# Patient Record
Sex: Female | Born: 1935
Health system: Southern US, Community
[De-identification: ages and names within clinical notes are randomized; demographics above are authoritative.]

## PROBLEM LIST (undated history)

## (undated) DIAGNOSIS — I10 Essential (primary) hypertension: Secondary | ICD-10-CM

## (undated) DIAGNOSIS — I2510 Atherosclerotic heart disease of native coronary artery without angina pectoris: Secondary | ICD-10-CM

## (undated) DIAGNOSIS — C189 Malignant neoplasm of colon, unspecified: Secondary | ICD-10-CM

## (undated) DIAGNOSIS — E039 Hypothyroidism, unspecified: Secondary | ICD-10-CM

## (undated) DIAGNOSIS — E7800 Pure hypercholesterolemia, unspecified: Secondary | ICD-10-CM

## (undated) DIAGNOSIS — E119 Type 2 diabetes mellitus without complications: Secondary | ICD-10-CM

## (undated) HISTORY — PX: CATARACT EXTRACTION, BILATERAL: SHX1313

## (undated) HISTORY — PX: LAPAROSCOPIC CHOLECYSTECTOMY: SUR755

## (undated) HISTORY — DX: Essential (primary) hypertension: I10

## (undated) HISTORY — DX: Pure hypercholesterolemia, unspecified: E78.00

## (undated) HISTORY — DX: Malignant neoplasm of colon, unspecified: C18.9

## (undated) HISTORY — DX: Atherosclerotic heart disease of native coronary artery without angina pectoris: I25.10

## (undated) HISTORY — PX: OTHER SURGICAL HISTORY: SHX169

## (undated) HISTORY — PX: APPENDECTOMY: SHX54

## (undated) HISTORY — DX: Hypothyroidism, unspecified: E03.9

## (undated) HISTORY — DX: Type 2 diabetes mellitus without complications: E11.9

---

## 2001-12-30 HISTORY — PX: COLECTOMY: SHX59

## 2005-09-27 ENCOUNTER — Encounter: Payer: Self-pay | Admitting: Cardiology

## 2006-12-30 HISTORY — PX: CORONARY STENT PLACEMENT: SHX1402

## 2011-02-06 ENCOUNTER — Encounter: Payer: Self-pay | Admitting: Family Medicine

## 2011-03-01 ENCOUNTER — Encounter: Payer: Self-pay | Admitting: Family Medicine

## 2011-03-01 ENCOUNTER — Ambulatory Visit (INDEPENDENT_AMBULATORY_CARE_PROVIDER_SITE_OTHER): Payer: Medicare Other | Admitting: Family Medicine

## 2011-03-01 DIAGNOSIS — I2510 Atherosclerotic heart disease of native coronary artery without angina pectoris: Secondary | ICD-10-CM

## 2011-03-01 DIAGNOSIS — I10 Essential (primary) hypertension: Secondary | ICD-10-CM | POA: Insufficient documentation

## 2011-03-01 DIAGNOSIS — E119 Type 2 diabetes mellitus without complications: Secondary | ICD-10-CM | POA: Insufficient documentation

## 2011-03-01 DIAGNOSIS — E785 Hyperlipidemia, unspecified: Secondary | ICD-10-CM | POA: Insufficient documentation

## 2011-03-01 DIAGNOSIS — C182 Malignant neoplasm of ascending colon: Secondary | ICD-10-CM | POA: Insufficient documentation

## 2011-03-07 NOTE — Assessment & Plan Note (Signed)
Summary: NOV DM   Vital Signs:  Patient profile:   77 year old female Height:      64 inches Weight:      168 pounds BMI:     28.94 O2 Sat:      96 % on Room air Pulse rate:   71 / minute BP sitting:   130 / 75  (left arm) Cuff size:   large  Vitals Entered By: Payton Spark CMA (March 01, 2011 1:54 PM)  O2 Flow:  Room air CC: Mew to est. Discuss meds   Primary Care Provider:  Seymour Bars DO  CC:  Mew to est. Discuss meds.  History of Present Illness: 77 yo WF presents for NOV.  She moved here from [place], [place].  She has a hx of colon cancer,  CAD, HTN, DM, high chol, HTN.  She is due for med RFS and is interested to change her AZor to a generic version due to cost.  Reports no problems on ACEi in the past, her BP was just running too high.  She will need to est care locally with cards and GI.  Currently, she feels great.  Her sugars are well controlled but she needs to get tresting supplises.  She had labs done in Nov 2011.     Current Medications (verified): 1)  Atorvastatin Calcium 40 Mg Tabs (Atorvastatin Calcium) .... Take 1 Tab By Mouth At Bedtime 2)  Aspirin 325 Mg Tabs (Aspirin) .... Take 1 Tab By Mouth Once Daily 3)  Ocuvite  Tabs (Multiple Vitamins-Minerals) .... Take 1 Tab By Mouth Once Daily 4)  Glimepiride 4 Mg Tabs (Glimepiride) .... Take 1 Tab By Mouth Two Times A Day 5)  Azor 10-40 Mg Tabs (Amlodipine-Olmesartan) .... Take 1/2 Tab By Mouth Once Daily 6)  Synthroid 100 Mcg Tabs (Levothyroxine Sodium) .... Take 1 Tab By Mouth Once Daily 7)  Metformin Hcl 500 Mg Tabs (Metformin Hcl) .... Take 1 Tab By Mouth Two Times A Day 8)  Metoprolol Tartrate 25 Mg Tabs (Metoprolol Tartrate) .... Take 1 Tab By Mouth Once Daily 9)  Nitrofurantoin Macrocrystal 50 Mg Caps (Nitrofurantoin Macrocrystal) .... Take 1 Tab By Mouth Once Daily  Allergies (verified): 1)  ! Sulfa  Past History:  Past Medical History: CABG, 1998, 2 stents HTN T2DM high  chol Hypothyroidism G4P4  Past Surgical History: [id number] 2 stents 09 Lap Chole  partial colectomy for cancer 2003  Family History: mother DM daughter Hodgkins Lymphoma  Social History: HS grad.  Married with 4 grown kids. Moved from [place]. 5 cigs/ day. No regular exerice.  Review of Systems       no fevers/sweats/weakness, unexplained wt loss/gain, no change in vision, no difficulty hearing, ringing in ears, no hay fever/allergies, no CP/discomfort, no palpitations, no breast lump/nipple discharge, no cough/wheeze, no blood in stool, no N/V/D, no nocturia, no leaking urine, no unusual vag bleeding, no vaginal/penile discharge, no muscle/joint pain, no rash, no new/changing mole, no HA, no memory loss, no anxiety, no sleep problem, no depression, no unexplained lumps, no easy bruising/bleeding, no concern with sexual function   Physical Exam  General:  alert, well-developed, well-nourished, well-hydrated, and overweight-appearing.   Head:  normocephalic and atraumatic.   Mouth:  pharynx pink and moist and no erythema.   Neck:  no masses.   Lungs:  normal respiratory effort, no intercostal retractions, no accessory muscle use, and normal breath sounds.   Heart:  normal rate, regular rhythm, and no murmur.  Extremities:  no LE edema Skin:  color normal.   Cervical Nodes:  No lymphadenopathy noted Psych:  good eye contact, not anxious appearing, and not depressed appearing.     Impression & Recommendations:  Problem # 1:  ESSENTIAL HYPERTENSION, BENIGN (ICD-401.1) Changed Azor to Lotrel due to cost.  Will get old records, update labs next visit and f/u in 2 mos. Her updated medication list for this problem includes:    Amlodipine Besy-benazepril Hcl 5-20 Mg Caps (Amlodipine besy-benazepril hcl) .Marland Kitchen... 1 tab by mouth daily    Metoprolol Succinate 25 Mg Xr24h-tab (Metoprolol succinate) .Marland Kitchen... 1 tab by mouth by mouth once daily  BP today: 130/75  Problem # 2:  DIABETES  MELLITUS, TYPE II (ICD-250.00)  Her updated medication list for this problem includes:    Aspirin 325 Mg Tabs (Aspirin) .Marland Kitchen... Take 1 tab by mouth once daily    Glimepiride 4 Mg Tabs (Glimepiride) .Marland Kitchen... Take 1 tab by mouth two times a day    Amlodipine Besy-benazepril Hcl 5-20 Mg Caps (Amlodipine besy-benazepril hcl) .Marland Kitchen... 1 tab by mouth daily    Metformin Hcl 500 Mg Tabs (Metformin hcl) .Marland Kitchen... Take 1 tab by mouth two times a day  Problem # 3:  CAD (ICD-414.00)  Her updated medication list for this problem includes:    Aspirin 325 Mg Tabs (Aspirin) .Marland Kitchen... Take 1 tab by mouth once daily    Amlodipine Besy-benazepril Hcl 5-20 Mg Caps (Amlodipine besy-benazepril hcl) .Marland Kitchen... 1 tab by mouth daily    Metoprolol Succinate 25 Mg Xr24h-tab (Metoprolol succinate) .Marland Kitchen... 1 tab by mouth by mouth once daily  Problem # 4:  HYPERLIPIDEMIA (ICD-272.4)  Her updated medication list for this problem includes:    Atorvastatin Calcium 40 Mg Tabs (Atorvastatin calcium) .Marland Kitchen... Take 1 tab by mouth at bedtime  Complete Medication List: 1)  Atorvastatin Calcium 40 Mg Tabs (Atorvastatin calcium) .... Take 1 tab by mouth at bedtime 2)  Aspirin 325 Mg Tabs (Aspirin) .... Take 1 tab by mouth once daily 3)  Ocuvite Tabs (Multiple vitamins-minerals) .... Take 1 tab by mouth once daily 4)  Glimepiride 4 Mg Tabs (Glimepiride) .... Take 1 tab by mouth two times a day 5)  Amlodipine Besy-benazepril Hcl 5-20 Mg Caps (Amlodipine besy-benazepril hcl) .Marland Kitchen.. 1 tab by mouth daily 6)  Levothyroxine Sodium 100 Mcg Tabs (Levothyroxine sodium) .Marland Kitchen.. 1 tab by mouth once daily 7)  Metformin Hcl 500 Mg Tabs (Metformin hcl) .... Take 1 tab by mouth two times a day 8)  Metoprolol Succinate 25 Mg Xr24h-tab (Metoprolol succinate) .Marland Kitchen.. 1 tab by mouth by mouth once daily 9)  Nitrofurantoin Macrocrystal 50 Mg Caps (Nitrofurantoin macrocrystal) .... Take 1 tab by mouth once daily  Patient Instructions: 1)  Meds RFd. 2)  Change AZOR to generic  Lotrel once daily for BP. 3)  Call if any problems. 4)  will have [facility] delivery your diabetic supplies.   5)  Return for f/u with labs in 2 mos Prescriptions: METFORMIN HCL 500 MG TABS (METFORMIN HCL) Take 1 tab by mouth two times a day  #180 x 1   Entered and Authorized by:   Seymour Bars DO   Signed by:   Seymour Bars DO on 03/01/2011   Method used:   Electronically to        [facility]* (retail)             ,          Ph: [phone]  Fax: [phone]   RxID:   [id number] LEVOTHYROXINE SODIUM 100 MCG TABS (LEVOTHYROXINE SODIUM) 1 tab by mouth once daily  #90 x 1   Entered and Authorized by:   Seymour Bars DO   Signed by:   Seymour Bars DO on 03/01/2011   Method used:   Electronically to        [facility]* (retail)             ,          Ph: [phone]       Fax: [phone]   RxID:   [id number] GLIMEPIRIDE 4 MG TABS (GLIMEPIRIDE) Take 1 tab by mouth two times a day  #180 x 1   Entered and Authorized by:   Seymour Bars DO   Signed by:   Seymour Bars DO on 03/01/2011   Method used:   Electronically to        [facility]* (retail)             ,          Ph: [phone]       Fax: [phone]   RxID:   [id number] METOPROLOL SUCCINATE 25 MG XR24H-TAB (METOPROLOL SUCCINATE) 1 tab by mouth by mouth once daily  #90 x 1   Entered and Authorized by:   Seymour Bars DO   Signed by:   Seymour Bars DO on 03/01/2011   Method used:   Electronically to        [facility]* (retail)             ,          Ph: [phone]       Fax: [phone]   RxID:   [id number] ATORVASTATIN CALCIUM 40 MG TABS (ATORVASTATIN CALCIUM) Take 1 tab by mouth at bedtime  #90 x 1   Entered and Authorized by:   Seymour Bars DO   Signed by:   Seymour Bars DO on 03/01/2011   Method used:   Electronically to        [facility]* (retail)             ,          Ph: [phone]       Fax: [phone]   RxID:   [id number] AMLODIPINE BESY-BENAZEPRIL  HCL 5-20 MG CAPS (AMLODIPINE BESY-BENAZEPRIL HCL) 1 tab by mouth daily  #90 x 1   Entered and Authorized by:   Seymour Bars DO   Signed by:   Seymour Bars DO on 03/01/2011   Method used:   Electronically to        [facility]* (retail)             ,          Ph: [phone]       Fax: [phone]   RxID:   [id number]    Orders Added: 1)  New Patient Level III [95093]

## 2011-03-12 NOTE — Letter (Signed)
Summary: Intake Forms  Intake Forms   Imported By: Lanelle Bal 03/05/2011 09:05:56  _____________________________________________________________________  External Attachment:    Type:   Image     Comment:   External Document

## 2011-04-01 ENCOUNTER — Ambulatory Visit: Payer: Self-pay | Admitting: Family Medicine

## 2011-05-02 ENCOUNTER — Encounter: Payer: Self-pay | Admitting: Family Medicine

## 2011-05-02 ENCOUNTER — Ambulatory Visit (INDEPENDENT_AMBULATORY_CARE_PROVIDER_SITE_OTHER): Payer: Medicare Other | Admitting: Family Medicine

## 2011-05-02 VITALS — BP 123/69 | HR 65 | Ht 64.0 in | Wt 168.0 lb

## 2011-05-02 DIAGNOSIS — E119 Type 2 diabetes mellitus without complications: Secondary | ICD-10-CM

## 2011-05-02 DIAGNOSIS — Z85038 Personal history of other malignant neoplasm of large intestine: Secondary | ICD-10-CM

## 2011-05-02 DIAGNOSIS — C182 Malignant neoplasm of ascending colon: Secondary | ICD-10-CM

## 2011-05-02 DIAGNOSIS — I2510 Atherosclerotic heart disease of native coronary artery without angina pectoris: Secondary | ICD-10-CM

## 2011-05-02 DIAGNOSIS — E039 Hypothyroidism, unspecified: Secondary | ICD-10-CM

## 2011-05-02 LAB — POCT UA - MICROALBUMIN: Creatinine, POC: 100 mg/dL

## 2011-05-02 NOTE — Progress Notes (Signed)
  Subjective:    Patient ID: Gina [name], female    DOB: 1934/09/10, 77 y.o.   MRN: [id number]  HPI 69 WF presents for f/u visit.  I changed her Azor to Lotrel due to cost and it's working well but she's getting a little dizzy from it.  She is taking Amaryl and Metformin bid for her diabetes.  Her A1C is 7.1 today.  Her home sugars are usually 120s to 130s AM fasting.  She had one low at 59.She is also on Toprol XL for her BP.  Her urine micro and monofilament were normal other than a callus on her heels.  She denies chest pain or DOE.  She still needs a referral to GI and cardiology.    BP 123/69  Pulse 65  Ht 5\' 4"  (1.626 m)  Wt 168 lb (76.204 kg)  BMI 28.84 kg/m2  SpO2 97%  Patient Active Problem List  Diagnoses  . ADENOCARCINOMA, ASCENDING COLON  . DIABETES MELLITUS, TYPE II  . HYPERLIPIDEMIA  . ESSENTIAL HYPERTENSION, BENIGN  . CAD       Review of Systems  Constitutional: Negative for fever, fatigue and unexpected weight change.  Eyes: Negative for visual disturbance.  Respiratory: Negative for shortness of breath.   Cardiovascular: Negative for chest pain, palpitations and leg swelling.  Gastrointestinal: Negative for nausea, abdominal pain and diarrhea.  Genitourinary: Negative for frequency and difficulty urinating.  Neurological: Positive for light-headedness. Negative for weakness, numbness and headaches.  Psychiatric/Behavioral: Negative for dysphoric mood.       Objective:   Physical Exam  Constitutional: She appears well-developed and well-nourished. No distress.  Eyes: Conjunctivae are normal. Pupils are equal, round, and reactive to light.  Neck: Neck supple. No thyromegaly present.  Cardiovascular: Normal rate, regular rhythm and normal heart sounds.   Pulmonary/Chest: Effort normal and breath sounds normal.  Musculoskeletal: She exhibits no edema.  Skin: Skin is warm and dry.  Psychiatric: She has a normal mood and affect.          Assessment  & Plan:

## 2011-05-02 NOTE — Assessment & Plan Note (Signed)
Asymptomatic.  Stable on meds.  Wants to follow with cards annually since she had stents placed in 08.  Will get her in with Dr Jens Som.

## 2011-05-02 NOTE — Assessment & Plan Note (Signed)
A1C great at 7.1.  Continue current meds.  Update labs today.  Monofilament normal other than callused heels.  Urine microalbumin neg.  Will need eye exam soon.

## 2011-05-02 NOTE — Patient Instructions (Signed)
Will get you in with [facility] in [facility]iology here in [place].  [facility] will deliver sugar testing supplies.  A1C good a 7.1.  Labs today.  F/U in 3-4 mos.

## 2011-05-02 NOTE — Assessment & Plan Note (Signed)
Hx of colon cancer, reviewed treatment out of town.  No longer having problems but needs to follow with GI.  Will get her in with [facility].

## 2011-05-03 LAB — CBC WITH DIFFERENTIAL/PLATELET
Basophils Absolute: 0 10*3/uL (ref 0.0–0.1)
Basophils Relative: 0 % (ref 0–1)
Eosinophils Absolute: 0.1 10*3/uL (ref 0.0–0.7)
Eosinophils Relative: 1 % (ref 0–5)
Lymphs Abs: 3 10*3/uL (ref 0.7–4.0)
MCH: 30.7 pg (ref 26.0–34.0)
MCHC: 31 g/dL (ref 30.0–36.0)
MCV: 99 fL (ref 78.0–100.0)
Neutrophils Relative %: 59 % (ref 43–77)
Platelets: 310 10*3/uL (ref 150–400)
RBC: 3.98 MIL/uL (ref 3.87–5.11)
RDW: 13.9 % (ref 11.5–15.5)

## 2011-05-03 LAB — COMPLETE METABOLIC PANEL WITH GFR
ALT: 18 U/L (ref 0–35)
AST: 15 U/L (ref 0–37)
Alkaline Phosphatase: 52 U/L (ref 39–117)
Creat: 0.71 mg/dL (ref 0.40–1.20)
GFR, Est African American: >60 mL/min (ref >60)
Sodium: 140 mEq/L (ref 135–145)
Total Bilirubin: 0.4 mg/dL (ref 0.3–1.2)
Total Protein: 7.7 g/dL (ref 6.0–8.3)

## 2011-05-03 LAB — LIPID PANEL
HDL: 40 mg/dL (ref >39)
LDL Cholesterol: 53 mg/dL (ref 0–99)
Total CHOL/HDL Ratio: 2.9 Ratio
Triglycerides: 108 mg/dL (ref <150)

## 2011-05-03 LAB — TSH: TSH: 1.163 u[IU]/mL (ref 0.350–4.500)

## 2011-05-04 ENCOUNTER — Telehealth: Payer: Self-pay | Admitting: Family Medicine

## 2011-05-04 NOTE — Telephone Encounter (Signed)
Pls let pt know that her blood counts, liver and kidney function caem back normal.  Cholesterol is at goal and thyroid function looks great.

## 2011-05-06 NOTE — Telephone Encounter (Signed)
Pt aware of the below

## 2011-05-22 ENCOUNTER — Other Ambulatory Visit: Payer: Self-pay | Admitting: Family Medicine

## 2011-05-22 NOTE — Telephone Encounter (Signed)
Pt needs lancets, test strips, bs machine sent to the [facility].  Pt stated getting low on supplies.  Dropped off the information to the front staff 05-21-11.

## 2011-05-30 NOTE — Telephone Encounter (Signed)
Pt was suppose to call back with information so this process could be taken care of since she is previously not set up with [facility].  Passed information along to Payton Spark, CMA to hold on to in case the patient calls back with information. Jarvis Newcomer, LPN [facility]

## 2011-05-31 ENCOUNTER — Encounter: Payer: Self-pay | Admitting: Cardiology

## 2011-06-03 ENCOUNTER — Other Ambulatory Visit: Payer: Self-pay | Admitting: Family Medicine

## 2011-06-03 ENCOUNTER — Telehealth: Payer: Self-pay | Admitting: Family Medicine

## 2011-06-03 DIAGNOSIS — I10 Essential (primary) hypertension: Secondary | ICD-10-CM

## 2011-06-03 MED ORDER — AMLODIPINE BESY-BENAZEPRIL HCL 5-20 MG PO CAPS: 1.0000 | ORAL_CAPSULE | Freq: Every day | ORAL | Status: DC

## 2011-06-03 NOTE — Telephone Encounter (Signed)
Pt called and requested a return call from the nurse but did not leave detailed information.   Plan:  [facility] for patient that her call was returned, and she'll need to call back since nurse does not know how to help her at this point since no detailed information left originally.  Pt did call back and nurse got call and pt said she has 3 pills left and [facility] will not release her medication until 06-08-11, however, they should have gotten her med to her by 06-01-11 at which according to last refill date amlodipine was  filled on 03-01-11.  First 90 day supply was finished on 06-01-11, and pt would have needed med by this time to not run out.   Plan:  Pt instructed to call mail order and ask them why they didn't get it to her on time and to tell them they will need to expedite the delivery before 3 days at which time she will be out of medication.  Pt voiced understanding and will call triage nurse back if any problem getting this accomplished. Jarvis Newcomer, LPN [facility]

## 2011-06-03 NOTE — Telephone Encounter (Signed)
Pt called the [facility] mail order and they should have already sent her refill of amlodipine but because she hadn't received they said for our office to send a 14 day supply of the medication and the medicare insurance will cover this 14 day supply while she is waiting on her mail order to arrive. Plan:  Amlodipine 5-20 mg daily # 14 /0refills sent to [facility].  Pt aware. Jarvis Newcomer, LPN [facility]

## 2011-06-19 ENCOUNTER — Encounter: Payer: Self-pay | Admitting: Cardiology

## 2011-06-19 ENCOUNTER — Ambulatory Visit (INDEPENDENT_AMBULATORY_CARE_PROVIDER_SITE_OTHER): Payer: Medicare Other | Admitting: Cardiology

## 2011-06-19 DIAGNOSIS — I10 Essential (primary) hypertension: Secondary | ICD-10-CM

## 2011-06-19 DIAGNOSIS — F17200 Nicotine dependence, unspecified, uncomplicated: Secondary | ICD-10-CM

## 2011-06-19 DIAGNOSIS — Z720 Tobacco use: Secondary | ICD-10-CM

## 2011-06-19 DIAGNOSIS — I2510 Atherosclerotic heart disease of native coronary artery without angina pectoris: Secondary | ICD-10-CM

## 2011-06-19 DIAGNOSIS — E785 Hyperlipidemia, unspecified: Secondary | ICD-10-CM

## 2011-06-19 NOTE — Progress Notes (Signed)
HPI: 77 year old female with past medical history of coronary artery disease for establishment. Patient had coronary artery bypass and graft in [facility] in 1998. In 2008 she had stents placed. I do not have records available. She apparently has had stress tests since then. She recently moved to this area and now presents to establish. She has dyspnea with more extreme activities but not with routine activities. There is no orthopnea, PND, pedal edema, palpitations, syncope or chest pain.  Current Outpatient Prescriptions  Medication Sig Dispense Refill  . amLODipine-benazepril (LOTREL) 5-20 MG per capsule Take 1 capsule by mouth daily.  14 capsule  0  . aspirin 325 MG tablet Take 325 mg by mouth daily.        Marland Kitchen atorvastatin (LIPITOR) 40 MG tablet Take 40 mg by mouth daily.        Marland Kitchen glimepiride (AMARYL) 4 MG tablet Take 4 mg by mouth 2 (two) times daily.        Marland Kitchen levothyroxine (SYNTHROID, LEVOTHROID) 100 MCG tablet Take 100 mcg by mouth daily.        . metFORMIN (GLUCOPHAGE) 500 MG tablet Take 500 mg by mouth 2 (two) times daily with a meal.        . metoprolol succinate (TOPROL-XL) 25 MG 24 hr tablet Take 25 mg by mouth daily.        . Multiple Vitamins-Minerals (MULTIVITAMIN & MINERAL PO) Take by mouth.        . nitrofurantoin (MACRODANTIN) 50 MG capsule Take 50 mg by mouth daily as needed.        . nitroGLYCERIN (NITROSTAT) 0.4 MG SL tablet Place 0.4 mg under the tongue every 5 (five) minutes as needed.          Allergies  Allergen Reactions  . Sulfonamide Derivatives     Past Medical History  Diagnosis Date  . HTN (hypertension)   . T2DM (type 2 diabetes mellitus)   . High cholesterol   . Hypothyroidism   . CAD (coronary artery disease)   . Colon cancer     Chemotherapy    Past Surgical History  Procedure Date  . Cabg     1998.   . Coronary stent placement 2008  . Laparoscopic cholecystectomy   . Colectomy 2003    for cancer. partial  . Appendectomy      History   Social History  . Marital Status: Married    Spouse Name: N/A    Number of Children: 4  . Years of Education: N/A   Occupational History  .      Retired   Social History Main Topics  . Smoking status: Current Everyday Smoker    Types: Cigarettes  . Smokeless tobacco: Not on file   Comment: 5 cigarettes/day   . Alcohol Use: Yes     Occasional glass of wine  . Drug Use: Not on file  . Sexually Active: Not on file   Other Topics Concern  . Not on file   Social History Narrative   Married, 4 grown kids. Moved from [place], no regular exercise.     Family History  Problem Relation Age of Onset  . Diabetes Mother   . Coronary artery disease Father     Died of MI at age 15    ROS: no fevers or chills, productive cough, hemoptysis, dysphasia, odynophagia, melena, hematochezia, dysuria, hematuria, rash, seizure activity, orthopnea, PND, pedal edema, claudication. Remaining systems are negative.  Physical Exam: General:  Well developed/well  nourished in NAD Skin warm/dry Patient not depressed No peripheral clubbing Back-normal HEENT-normal/normal eyelids Neck supple/normal carotid upstroke bilaterally; no bruits; no JVD; no thyromegaly chest - CTA/ normal expansion CV - RRR/normal S1 and S2; no murmurs, rubs or gallops;  PMI nondisplaced Abdomen -NT/ND, no HSM, no mass, + bowel sounds, no bruit 2+ femoral pulses, no bruits Ext-no edema, chords, 2+ DP Neuro-grossly nonfocal  ECG Normal sinus rhythm with no ST changes.

## 2011-06-19 NOTE — Assessment & Plan Note (Signed)
Continue aspirin, beta blocker and statin. We will obtain records from [place] concerning previous bypass surgery, stent and most recent stress test. Continue risk factor modification.

## 2011-06-19 NOTE — Patient Instructions (Signed)
Your physician wants you to follow-up in: ONE YEAR You will receive a reminder letter in the mail two months in advance. If you don't receive a letter, please call our office to schedule the follow-up appointment.  

## 2011-06-19 NOTE — Assessment & Plan Note (Signed)
Patient counseled on discontinuing. 

## 2011-06-19 NOTE — Assessment & Plan Note (Signed)
Blood pressure control. Continue present medications. Potassium and renal function monitored by primary care. 

## 2011-06-19 NOTE — Assessment & Plan Note (Signed)
Continue statin. Lipids and liver monitored by primary care. 

## 2011-06-24 ENCOUNTER — Telehealth: Payer: Self-pay | Admitting: Cardiology

## 2011-06-24 NOTE — Telephone Encounter (Addendum)
ROI Faxed to [facility]  @ [phone]   06/24/11/km  Records received from [facility] gave to [facility] 79/12/km

## 2011-07-15 ENCOUNTER — Ambulatory Visit: Payer: Medicare Other | Admitting: Internal Medicine

## 2011-08-09 ENCOUNTER — Other Ambulatory Visit: Payer: Self-pay | Admitting: Family Medicine

## 2011-08-12 ENCOUNTER — Encounter: Payer: Self-pay | Admitting: Cardiology

## 2011-08-13 ENCOUNTER — Encounter: Payer: Self-pay | Admitting: Family Medicine

## 2011-08-13 DIAGNOSIS — E039 Hypothyroidism, unspecified: Secondary | ICD-10-CM | POA: Insufficient documentation

## 2011-08-13 DIAGNOSIS — N390 Urinary tract infection, site not specified: Secondary | ICD-10-CM | POA: Insufficient documentation

## 2011-08-16 ENCOUNTER — Encounter: Payer: Self-pay | Admitting: Cardiology

## 2011-08-23 ENCOUNTER — Encounter: Payer: Self-pay | Admitting: Family Medicine

## 2011-08-23 ENCOUNTER — Ambulatory Visit (INDEPENDENT_AMBULATORY_CARE_PROVIDER_SITE_OTHER): Payer: Medicare Other | Admitting: Family Medicine

## 2011-08-23 DIAGNOSIS — E119 Type 2 diabetes mellitus without complications: Secondary | ICD-10-CM

## 2011-08-23 DIAGNOSIS — Z23 Encounter for immunization: Secondary | ICD-10-CM

## 2011-08-23 DIAGNOSIS — I10 Essential (primary) hypertension: Secondary | ICD-10-CM

## 2011-08-23 LAB — POCT GLYCOSYLATED HEMOGLOBIN (HGB A1C): Hemoglobin A1C: 6.7

## 2011-08-23 MED ORDER — AMBULATORY NON FORMULARY MEDICATION: Status: DC

## 2011-08-23 MED ORDER — CHLORTHALIDONE 25 MG PO TABS: 25.0000 mg | ORAL_TABLET | Freq: Every day | ORAL | Status: DC

## 2011-08-23 NOTE — Progress Notes (Signed)
  Subjective:    Patient ID: Gina [name], female    DOB: [birth year], 77 y.o.   MRN: [id number]  Diabetes She presents for her follow-up diabetic visit. She has type 2 diabetes mellitus. Her disease course has been improving. There are no hypoglycemic associated symptoms. Pertinent negatives for diabetes include no polydipsia and no polyuria. Symptoms are stable. Current diabetic treatment includes oral agent (monotherapy). She is compliant with treatment all of the time. She rarely participates in exercise. There is no change in her home blood glucose trend. An ACE inhibitor/angiotensin II receptor blocker is being taken. Eye exam is not current.   HTN - Says the metoprolol makes her dizzy. She would like to take this.  Take lotrel at bedtime. Does well with this one. She has been very compliant with her meds. No SOB.     Review of Systems  Genitourinary: Negative for polyuria.  Hematological: Negative for polydipsia.       Objective:   Physical Exam  Constitutional: She is oriented to person, place, and time. She appears well-developed and well-nourished.  HENT:  Head: Normocephalic and atraumatic.  Cardiovascular: Normal rate, regular rhythm and normal heart sounds.   Pulmonary/Chest: Effort normal and breath sounds normal.  Neurological: She is alert and oriented to person, place, and time.  Skin: Skin is warm and dry.  Psychiatric: She has a normal mood and affect. Her behavior is normal.          Assessment & Plan:

## 2011-08-23 NOTE — Assessment & Plan Note (Signed)
BP well controlled but will stop the metoprolol since making her feel dizzy. She has normal kidney function so will start a diuretic. F/u in 1 month for nurse BP check and

## 2011-08-23 NOTE — Patient Instructions (Addendum)
Remember to get your eye exam.  See me in 3 months for your diabetes You can schedule a nurse check in one month for your blood pressure.

## 2011-08-23 NOTE — Assessment & Plan Note (Addendum)
Lab Results  Component Value Date   HGBA1C 6.7 08/23/2011  Doing well on her current regimen.  Discussed getting her shingles vaccine. She wants to wait until next appt for her flu shot. F.U in 3 months. Given pneumonia vaccine today.

## 2011-08-26 ENCOUNTER — Other Ambulatory Visit: Payer: Self-pay | Admitting: Family Medicine

## 2011-08-27 ENCOUNTER — Encounter: Payer: Self-pay | Admitting: Cardiology

## 2011-08-28 ENCOUNTER — Other Ambulatory Visit: Payer: Self-pay | Admitting: *Deleted

## 2011-08-28 MED ORDER — LEVOTHYROXINE SODIUM 100 MCG PO TABS: 100.0000 ug | ORAL_TABLET | Freq: Every day | ORAL | Status: DC

## 2011-08-29 ENCOUNTER — Telehealth: Payer: Self-pay | Admitting: *Deleted

## 2011-08-29 ENCOUNTER — Telehealth: Payer: Self-pay | Admitting: Family Medicine

## 2011-08-29 NOTE — Telephone Encounter (Signed)
Pt notifeid of MD instructions. KJ LPN

## 2011-08-29 NOTE — Telephone Encounter (Signed)
Stop the BP pill through Monday and see if feel better.  It can cause nausea.  I can cause dizziness too but all BP meds can cause dizziness. Call on Monday to let me know if better or not. Make sure staying hydrated.

## 2011-08-29 NOTE — Telephone Encounter (Signed)
Pt called to clarify the message she had received from Kathlene November, LPN earlier today regarding the BP medication. Plan:  Pt informed to stop the current BP medication til next Monday to see if her symptoms and dizziness, nausea will disepate.  Told to call with update report next Tuesday morning since office closed on Monday for holiday.  Pt voiced understanding. Jarvis Newcomer, LPN [facility]

## 2011-08-29 NOTE — Telephone Encounter (Signed)
Pt husband calls and states got the pneumonia vaccine while in for her appt 5 days ago. Has been sick with nausea, diarrhea, no energy, afebrile since. Also you changed her BP med due to dizziness from the last BP med. Is having dizziness as well. Question if this is from BP med or pneumo vac. Please advise

## 2011-09-03 ENCOUNTER — Telehealth: Payer: Self-pay | Admitting: Family Medicine

## 2011-09-03 MED ORDER — AMLODIPINE BESY-BENAZEPRIL HCL 10-40 MG PO CAPS: 1.0000 | ORAL_CAPSULE | Freq: Every day | ORAL | Status: DC

## 2011-09-03 NOTE — Telephone Encounter (Signed)
If she is ok with it we can increase her lotrel.  Let me know if ok.

## 2011-09-03 NOTE — Telephone Encounter (Signed)
Pt is willing to do increase on the lotrel.  Please advise of new dose. Plan:  Routed to Dr. Marlyne Beards, LPN [facility]

## 2011-09-03 NOTE — Telephone Encounter (Signed)
New rx sent to local pharm.

## 2011-09-03 NOTE — Telephone Encounter (Signed)
Pt called and spoke with the triage nurse today, and said she had been recently placed on a BP med hygroton and she started experiencing symptoms such as:  Extreme fatigue and nausea, and eyes felt swollen.  She was told to stop the medication due to these side effects.  Pt called to report that since she was told to stop the medication last week that her symptoms have disepated, and she feels much better. Plan:  Routed this encounter to the provider for review and or further instructions/orders. Gina Newcomer, LPN [facility]

## 2011-09-03 NOTE — Telephone Encounter (Signed)
Pt informed of the new dose that changed from 5/20 to 10/40mg .  Notified [facility] to make sure they had Rx ready for the pt to pup.  They said they would have ready in 30 minutes.  Pt informed. Jarvis Newcomer, LPN [facility]

## 2011-09-17 ENCOUNTER — Other Ambulatory Visit: Payer: Self-pay | Admitting: Family Medicine

## 2011-09-17 MED ORDER — ATORVASTATIN CALCIUM 40 MG PO TABS: 40.0000 mg | ORAL_TABLET | Freq: Every day | ORAL | Status: DC

## 2011-09-17 NOTE — Telephone Encounter (Signed)
Pt called for refill of her atorvastatin.  Send to [facility] order.  Plan:  Reviewed the chart.  Last lab for chol 04-2011.  Labs looked good.  Refilled 90 day supply without any refills.  Pt will then need labs. Jarvis Newcomer, LPN [facility]

## 2011-09-26 ENCOUNTER — Ambulatory Visit: Payer: Medicare Other

## 2011-09-27 ENCOUNTER — Other Ambulatory Visit: Payer: Self-pay | Admitting: *Deleted

## 2011-09-27 MED ORDER — GLIMEPIRIDE 4 MG PO TABS: 4.0000 mg | ORAL_TABLET | Freq: Two times a day (BID) | ORAL | Status: DC

## 2011-11-03 ENCOUNTER — Other Ambulatory Visit: Payer: Self-pay | Admitting: Family Medicine

## 2011-11-12 ENCOUNTER — Other Ambulatory Visit: Payer: Self-pay | Admitting: *Deleted

## 2011-11-29 ENCOUNTER — Encounter: Payer: Self-pay | Admitting: Family Medicine

## 2011-12-05 ENCOUNTER — Encounter: Payer: Self-pay | Admitting: Family Medicine

## 2011-12-05 ENCOUNTER — Ambulatory Visit (INDEPENDENT_AMBULATORY_CARE_PROVIDER_SITE_OTHER): Payer: Medicare Other | Admitting: Family Medicine

## 2011-12-05 DIAGNOSIS — E119 Type 2 diabetes mellitus without complications: Secondary | ICD-10-CM

## 2011-12-05 DIAGNOSIS — I10 Essential (primary) hypertension: Secondary | ICD-10-CM

## 2011-12-05 DIAGNOSIS — Z23 Encounter for immunization: Secondary | ICD-10-CM

## 2011-12-05 MED ORDER — AMBULATORY NON FORMULARY MEDICATION: Status: DC

## 2011-12-05 NOTE — Patient Instructions (Signed)
We will make a referral for your eye exam.

## 2011-12-05 NOTE — Progress Notes (Signed)
  Subjective:    Patient ID: Gina [name], female    DOB: 1934/09/22, 77 y.o.   MRN: [id number]  Diabetes She presents for her follow-up diabetic visit. She has type 2 diabetes mellitus. Her disease course has been stable. There are no hypoglycemic associated symptoms. Pertinent negatives for diabetes include no blurred vision, no foot paresthesias, no foot ulcerations, no polydipsia, no polyphagia, no polyuria, no visual change and no weight loss. Symptoms are stable. Current diabetic treatment includes oral agent (monotherapy). She is compliant with treatment all of the time. There is no change in her home blood glucose trend. Her breakfast blood glucose range is generally 110-130 mg/dl.     Review of Systems  Constitutional: Negative for weight loss.  Eyes: Negative for blurred vision.  Genitourinary: Negative for polyuria.  Hematological: Negative for polydipsia and polyphagia.       Objective:   Physical Exam  Constitutional: She is oriented to person, place, and time. She appears well-developed.  HENT:  Head: Normocephalic and atraumatic.  Cardiovascular: Normal rate, regular rhythm and normal heart sounds.   Pulmonary/Chest: Effort normal and breath sounds normal.  Musculoskeletal: She exhibits no edema.  Neurological: She is alert and oriented to person, place, and time.  Skin: Skin is warm and dry.  Psychiatric: She has a normal mood and affect.          Assessment & Plan:  DM- A1C looks great. It 6.4. Doing well.  Continue current regimen. F.U in 4 mo. Due for eye exam. Hasn't found an eye doc in the area so will make referral for her.   HTN - Looks great! contiue current regimen. F/U in 4 mo.

## 2011-12-13 ENCOUNTER — Other Ambulatory Visit: Payer: Self-pay | Admitting: Family Medicine

## 2011-12-17 ENCOUNTER — Other Ambulatory Visit: Payer: Self-pay | Admitting: *Deleted

## 2012-01-15 ENCOUNTER — Telehealth: Payer: Self-pay | Admitting: *Deleted

## 2012-01-15 NOTE — Telephone Encounter (Signed)
They wanted to let you know that the pt just called and cancelled this referral that you had scheduled for them.   FYI

## 2012-02-13 ENCOUNTER — Other Ambulatory Visit: Payer: Self-pay | Admitting: *Deleted

## 2012-02-13 MED ORDER — LEVOTHYROXINE SODIUM 100 MCG PO TABS: 100.0000 ug | ORAL_TABLET | Freq: Every day | ORAL | Status: DC

## 2012-02-13 MED ORDER — GLIMEPIRIDE 4 MG PO TABS: 4.0000 mg | ORAL_TABLET | Freq: Two times a day (BID) | ORAL | Status: DC

## 2012-02-13 MED ORDER — AMLODIPINE BESY-BENAZEPRIL HCL 10-40 MG PO CAPS: 1.0000 | ORAL_CAPSULE | Freq: Every day | ORAL | Status: DC

## 2012-03-25 ENCOUNTER — Other Ambulatory Visit: Payer: Self-pay | Admitting: *Deleted

## 2012-03-25 MED ORDER — ATORVASTATIN CALCIUM 40 MG PO TABS: 40.0000 mg | ORAL_TABLET | Freq: Every day | ORAL | Status: DC

## 2012-04-17 ENCOUNTER — Encounter: Payer: Self-pay | Admitting: *Deleted

## 2012-04-24 ENCOUNTER — Ambulatory Visit (INDEPENDENT_AMBULATORY_CARE_PROVIDER_SITE_OTHER): Payer: Medicare Other | Admitting: Family Medicine

## 2012-04-24 ENCOUNTER — Encounter: Payer: Self-pay | Admitting: Family Medicine

## 2012-04-24 VITALS — BP 124/77 | HR 102 | Wt 166.0 lb

## 2012-04-24 DIAGNOSIS — I10 Essential (primary) hypertension: Secondary | ICD-10-CM | POA: Diagnosis not present

## 2012-04-24 DIAGNOSIS — Z9181 History of falling: Secondary | ICD-10-CM

## 2012-04-24 DIAGNOSIS — E119 Type 2 diabetes mellitus without complications: Secondary | ICD-10-CM

## 2012-04-24 DIAGNOSIS — Z1331 Encounter for screening for depression: Secondary | ICD-10-CM

## 2012-04-24 DIAGNOSIS — Z1211 Encounter for screening for malignant neoplasm of colon: Secondary | ICD-10-CM

## 2012-04-24 LAB — POCT GLYCOSYLATED HEMOGLOBIN (HGB A1C): Hemoglobin A1C: 6.7

## 2012-04-24 NOTE — Progress Notes (Addendum)
  Subjective:    Patient ID: Gina [name], female    DOB: [birth year], 78 y.o.   MRN: [id number]  Hypertension This is a chronic problem. The current episode started more than 1 month ago. The problem is controlled. Pertinent negatives include no blurred vision, chest pain or shortness of breath. There are no associated agents to hypertension. Past treatments include ACE inhibitors and calcium channel blockers. The current treatment provides mild improvement. There are no compliance problems.   Diabetes She has type 2 diabetes mellitus. Her disease course has been stable. There are no hypoglycemic associated symptoms. Pertinent negatives for diabetes include no blurred vision, no chest pain, no polydipsia, no polyphagia, no polyuria and no visual change. There are no hypoglycemic complications. Symptoms are stable. There are no diabetic complications. Risk factors for coronary artery disease include obesity and sedentary lifestyle. Current diabetic treatment includes oral agent (dual therapy). She is compliant with treatment all of the time. She rarely participates in exercise. There is no change in her home blood glucose trend. An ACE inhibitor/angiotensin II receptor blocker is being taken. Eye exam is not current.      Review of Systems  Eyes: Negative for blurred vision.  Respiratory: Negative for shortness of breath.   Cardiovascular: Negative for chest pain.  Genitourinary: Negative for polyuria.  Hematological: Negative for polydipsia and polyphagia.       Objective:   Physical Exam  Constitutional: She is oriented to person, place, and time. She appears well-developed and well-nourished.  HENT:  Head: Normocephalic and atraumatic.  Cardiovascular: Normal rate, regular rhythm and normal heart sounds.        No carotid bruits.   Pulmonary/Chest: Effort normal and breath sounds normal.  Neurological: She is alert and oriented to person, place, and time.  Skin: Skin is warm and  dry.  Psychiatric: She has a normal mood and affect. Her behavior is normal.          Assessment & Plan:  HTN - blood pressure is well controlled. Continue current regimen. She is due for CMP and fasting lipid panel. Given lab slip today.  DM- doing well. Her A1c today is at goal but is up a little bit from December. Encouraged her to try getting some type of regular exercise routine. I gave her handout about diabetes and exercise. She plans on getting her eye exams and is a check with her insurance.  She wants to postpone the shingles vaccine in the next year.  Screening colonoscopy. She is due. Her last one was in [place], so we  will make a referral here locally.  Fall risk-score of 4 which is low risk.  Depression screening-PHQ of 1. Negative for depression.

## 2012-04-24 NOTE — Patient Instructions (Signed)

## 2012-05-04 DIAGNOSIS — E119 Type 2 diabetes mellitus without complications: Secondary | ICD-10-CM | POA: Diagnosis not present

## 2012-05-05 LAB — COMPLETE METABOLIC PANEL WITH GFR
CO2: 23 mEq/L (ref 19–32)
Creat: 0.67 mg/dL (ref 0.50–1.10)
GFR, Est African American: >89 mL/min
GFR, Est Non African American: 86 mL/min
Glucose, Bld: 159 mg/dL — ABNORMAL HIGH (ref 70–99)
Total Bilirubin: 0.3 mg/dL (ref 0.3–1.2)
Total Protein: 7.2 g/dL (ref 6.0–8.3)

## 2012-05-05 LAB — LIPID PANEL
HDL: 41 mg/dL (ref >39)
Triglycerides: 117 mg/dL (ref <150)

## 2012-06-23 ENCOUNTER — Other Ambulatory Visit: Payer: Self-pay | Admitting: Family Medicine

## 2012-07-24 ENCOUNTER — Ambulatory Visit: Payer: Medicare Other | Admitting: Family Medicine

## 2012-07-29 DIAGNOSIS — H5210 Myopia, unspecified eye: Secondary | ICD-10-CM | POA: Diagnosis not present

## 2012-07-29 DIAGNOSIS — H52229 Regular astigmatism, unspecified eye: Secondary | ICD-10-CM | POA: Diagnosis not present

## 2012-07-29 DIAGNOSIS — H2589 Other age-related cataract: Secondary | ICD-10-CM | POA: Diagnosis not present

## 2012-07-29 DIAGNOSIS — H524 Presbyopia: Secondary | ICD-10-CM | POA: Diagnosis not present

## 2012-07-29 DIAGNOSIS — H353190 Nonexudative age-related macular degeneration, unspecified eye, stage unspecified: Secondary | ICD-10-CM | POA: Diagnosis not present

## 2012-07-29 DIAGNOSIS — H01009 Unspecified blepharitis unspecified eye, unspecified eyelid: Secondary | ICD-10-CM | POA: Diagnosis not present

## 2012-08-26 ENCOUNTER — Encounter: Payer: Self-pay | Admitting: Family Medicine

## 2012-08-26 ENCOUNTER — Ambulatory Visit (INDEPENDENT_AMBULATORY_CARE_PROVIDER_SITE_OTHER): Payer: Medicare Other | Admitting: Family Medicine

## 2012-08-26 VITALS — BP 139/73 | HR 68 | Wt 160.0 lb

## 2012-08-26 DIAGNOSIS — Z85038 Personal history of other malignant neoplasm of large intestine: Secondary | ICD-10-CM | POA: Diagnosis not present

## 2012-08-26 DIAGNOSIS — E119 Type 2 diabetes mellitus without complications: Secondary | ICD-10-CM

## 2012-08-26 DIAGNOSIS — F411 Generalized anxiety disorder: Secondary | ICD-10-CM

## 2012-08-26 DIAGNOSIS — F419 Anxiety disorder, unspecified: Secondary | ICD-10-CM

## 2012-08-26 DIAGNOSIS — G609 Hereditary and idiopathic neuropathy, unspecified: Secondary | ICD-10-CM | POA: Diagnosis not present

## 2012-08-26 DIAGNOSIS — G629 Polyneuropathy, unspecified: Secondary | ICD-10-CM

## 2012-08-26 LAB — POCT UA - MICROALBUMIN
Albumin/Creatinine Ratio, Urine, POC: <30
Creatinine, POC: 50 mg/dL
Microalbumin Ur, POC: 10 mg/dL

## 2012-08-26 MED ORDER — ALPRAZOLAM 0.5 MG PO TABS: 0.2500 mg | ORAL_TABLET | Freq: Three times a day (TID) | ORAL | Status: DC | PRN

## 2012-08-26 NOTE — Patient Instructions (Signed)
Remember to get your diabetic eye exam.  

## 2012-08-26 NOTE — Progress Notes (Signed)
  Subjective:    Patient ID: Gina [name], female    DOB: [birth year], 78 y.o.   MRN: [id number]  Diabetes She presents for her follow-up diabetic visit. She has type 2 diabetes mellitus. Her disease course has been stable. There are no hypoglycemic associated symptoms. Pertinent negatives for diabetes include no blurred vision, no foot paresthesias, no foot ulcerations, no polydipsia, no polyphagia, no polyuria and no visual change. Symptoms are stable. Her weight is stable. There is no change in her home blood glucose trend. An ACE inhibitor/angiotensin II receptor blocker is being taken.    Anxiety - Husband has been in the hospital with a large bleeding ulcer.  They plan on sending him home today.  She's been very stressed because of this. She says she used to take alprazolam and use it sparingly. Says she would like a refill on her. In fact she has an old bottle home with lots of tabs left but says it has expired.  She does complain of some tingling at times in her feet. No pain. No numbness. She says it feels more like a tingling. This is started more recently the last few weeks. No worsening or alleviating symptoms.  Review of Systems  Eyes: Negative for blurred vision.  Genitourinary: Negative for polyuria.  Hematological: Negative for polydipsia and polyphagia.       Objective:   Physical Exam  Constitutional: She is oriented to person, place, and time. She appears well-developed and well-nourished.  HENT:  Head: Normocephalic and atraumatic.  Cardiovascular: Normal rate, regular rhythm and normal heart sounds.        DP pulses 2+.    Pulmonary/Chest: Effort normal and breath sounds normal.  Neurological: She is alert and oriented to person, place, and time.  Skin: Skin is warm and dry.  Psychiatric: She has a normal mood and affect. Her behavior is normal.          Assessment & Plan:  DM- well controlled. Due for monofilament her microalbumin. Continue current  regimen. A1C is 6.7. Urine microalbumin was normal. Unfortunately we will do her monofilament at the next office visit we did not perform it today.  Anxiety - Doing ok. She would like a refill on alprazolam which are previous provider prescribed for her.  She has had continuing in her feet out like to check a B12. Normally her A1c looks fantastic so I do not think that diabetic neuropathy.  History of colon cancer-we had a long discussion about the fact that she needs to be reevaluated for repeat colonoscopy. She fell because she was 77 she no longer needed them but I reminded her that she did have colon cancer 10 years ago and is to be followed a little bit more closely. She says her husband sees Dr. Yevonne Pax at GI here in town and she would like to see him. I asked her to call me if she needs a referral but she says she plans to talk to Dr. Yevonne Pax at his next OV.

## 2012-08-27 LAB — TSH: TSH: 1.012 u[IU]/mL (ref 0.350–4.500)

## 2012-09-07 ENCOUNTER — Other Ambulatory Visit: Payer: Self-pay | Admitting: Family Medicine

## 2012-09-09 ENCOUNTER — Other Ambulatory Visit: Payer: Self-pay | Admitting: Family Medicine

## 2012-09-10 ENCOUNTER — Other Ambulatory Visit: Payer: Self-pay | Admitting: *Deleted

## 2012-09-10 ENCOUNTER — Other Ambulatory Visit: Payer: Self-pay | Admitting: Family Medicine

## 2012-09-10 MED ORDER — METFORMIN HCL 500 MG PO TABS: 500.0000 mg | ORAL_TABLET | Freq: Two times a day (BID) | ORAL | Status: DC

## 2012-09-16 ENCOUNTER — Other Ambulatory Visit: Payer: Self-pay | Admitting: Family Medicine

## 2012-10-03 DIAGNOSIS — Z23 Encounter for immunization: Secondary | ICD-10-CM | POA: Diagnosis not present

## 2012-12-02 ENCOUNTER — Encounter: Payer: Self-pay | Admitting: Physician Assistant

## 2012-12-02 ENCOUNTER — Ambulatory Visit (INDEPENDENT_AMBULATORY_CARE_PROVIDER_SITE_OTHER): Payer: Medicare Other | Admitting: Physician Assistant

## 2012-12-02 VITALS — BP 113/60 | HR 68 | Temp 97.9°F | Ht 65.0 in | Wt 160.0 lb

## 2012-12-02 DIAGNOSIS — H9209 Otalgia, unspecified ear: Secondary | ICD-10-CM

## 2012-12-02 DIAGNOSIS — J069 Acute upper respiratory infection, unspecified: Secondary | ICD-10-CM

## 2012-12-02 DIAGNOSIS — J029 Acute pharyngitis, unspecified: Secondary | ICD-10-CM

## 2012-12-02 DIAGNOSIS — H9201 Otalgia, right ear: Secondary | ICD-10-CM

## 2012-12-02 LAB — POCT RAPID STREP A (OFFICE): Rapid Strep A Screen: NEGATIVE

## 2012-12-02 MED ORDER — FLUTICASONE PROPIONATE 50 MCG/ACT NA SUSP: 2.0000 | Freq: Every day | NASAL | Status: DC

## 2012-12-02 NOTE — Progress Notes (Signed)
  Subjective:    Patient ID: Gina [name], female    DOB: 18-Apr-1934, 78 y.o.   MRN: [id number]  HPI Patient is a 78 yo female who presents to the clinic with sore throat that started last night. She has a very sick husband and she can't afford to give him anything. She is also having some right ear pain. No ear discharge. She denies any fever, chills, muscle aches, cough, SOB, HA's, or wheezing. She has not taken anything to make better. She has gargled with salt water which feels like it has helped some.She denies any sick contacts. She does have a history of strep. It has been 2 years since last strep throat but at that time doctors threaten to have to remove tonsils. She denies any sinus pressure.    Review of Systems     Objective:   Physical Exam  Constitutional: She is oriented to person, place, and time. She appears well-developed and well-nourished.  HENT:  Head: Normocephalic and atraumatic.  Right Ear: External ear normal.  Left Ear: External ear normal.  Nose: Nose normal.  Mouth/Throat: Oropharynx is clear and moist. No oropharyngeal exudate.       External canals normal bilaterally. TM of right ear revealed bulging TM without dullness. Light reflex present and ossicles in site.   Negative maxillary and frontal sinus tenderness to palpation.   Bilateral turbinates red and swollen.   Eyes: Conjunctivae normal are normal.  Neck: Normal range of motion. Neck supple.  Cardiovascular: Normal rate, regular rhythm and normal heart sounds.   Pulmonary/Chest: Effort normal and breath sounds normal. She has no wheezes.  Lymphadenopathy:    She has no cervical adenopathy.  Neurological: She is alert and oriented to person, place, and time.  Skin: Skin is warm and dry.  Psychiatric: She has a normal mood and affect. Her behavior is normal.          Assessment & Plan:  Sore throat/URI- Rapid Strep negative. Will send for culture due to history. Reassured patient that I  suspect infection is viral due to time frame and symptoms. Discussed loading up on vitamin c and cold eeze. Gave rx for flonase to help drain fluid behind ears. Encouraged sudafed for next 3 days to also help dry up excess fluid. Encouraged ibuprofen or Tylenol for pain control. Gave handout for other symptomatic treatment for sore throat. Call if not improving.

## 2012-12-02 NOTE — Patient Instructions (Addendum)
cold-eeze tablets and Vitamin C.   Sudafed for the next couple of days to help dry fluid behind ear up. Also start using Flonase 2 sprays once a day.   Sore Throat Sore throats may be caused by bacteria and viruses. They may also be caused by:  Smoking.  Pollution.  Allergies. If a sore throat is due to strep infection (a bacterial infection), you may need:  A throat swab.  A culture test to verify the strep infection. You will need one of these:  An antibiotic shot.  Oral medicine for a full 10 days. Strep infection is very contagious. A doctor should check any close contacts who have a sore throat or fever. A sore throat caused by a virus infection will usually last only 3-4 days. Antibiotics will not treat a viral sore throat.  Infectious mononucleosis (a viral disease), however, can cause a sore throat that lasts for up to 3 weeks. Mononucleosis can be diagnosed with blood tests. You must have been sick for at least 1 week in order for the test to give accurate results. HOME CARE INSTRUCTIONS   To treat a sore throat, take mild pain medicine.  Increase your fluids.  Eat a soft diet.  Do not smoke.  Gargling with warm water or salt water (1 tsp. salt in 8 oz. water) can be helpful.  Try throat sprays or lozenges or sucking on hard candy to ease the symptoms. Call your doctor if your sore throat lasts longer than 1 week.  SEEK IMMEDIATE MEDICAL CARE IF:  You have difficulty breathing.  You have increased swelling in the throat.  You have pain so severe that you are unable to swallow fluids or your saliva.  You have a severe headache, a high fever, vomiting, or a red rash. Document Released: 01/23/2005 Document Revised: 03/09/2012 Document Reviewed: 12/03/2007 [facility] Patient Information 2013 [place], [place].

## 2012-12-05 LAB — CULTURE, GROUP A STREP: Organism ID, Bacteria: NORMAL

## 2012-12-06 ENCOUNTER — Other Ambulatory Visit: Payer: Self-pay | Admitting: Family Medicine

## 2012-12-31 ENCOUNTER — Other Ambulatory Visit: Payer: Self-pay | Admitting: Family Medicine

## 2013-01-27 ENCOUNTER — Other Ambulatory Visit: Payer: Self-pay | Admitting: Family Medicine

## 2013-01-28 ENCOUNTER — Other Ambulatory Visit: Payer: Self-pay | Admitting: Family Medicine

## 2013-02-04 ENCOUNTER — Other Ambulatory Visit: Payer: Self-pay | Admitting: Family Medicine

## 2013-03-05 ENCOUNTER — Ambulatory Visit: Payer: Medicare Other | Admitting: Family Medicine

## 2013-03-08 ENCOUNTER — Ambulatory Visit (INDEPENDENT_AMBULATORY_CARE_PROVIDER_SITE_OTHER): Payer: Medicare Other | Admitting: Family Medicine

## 2013-03-08 ENCOUNTER — Encounter: Payer: Self-pay | Admitting: Family Medicine

## 2013-03-08 VITALS — BP 114/58 | HR 65 | Ht 65.0 in | Wt 160.0 lb

## 2013-03-08 DIAGNOSIS — F430 Acute stress reaction: Secondary | ICD-10-CM

## 2013-03-08 DIAGNOSIS — I10 Essential (primary) hypertension: Secondary | ICD-10-CM

## 2013-03-08 DIAGNOSIS — E119 Type 2 diabetes mellitus without complications: Secondary | ICD-10-CM | POA: Diagnosis not present

## 2013-03-08 DIAGNOSIS — E039 Hypothyroidism, unspecified: Secondary | ICD-10-CM

## 2013-03-08 DIAGNOSIS — E785 Hyperlipidemia, unspecified: Secondary | ICD-10-CM

## 2013-03-08 LAB — POCT GLYCOSYLATED HEMOGLOBIN (HGB A1C): Hemoglobin A1C: 6.3

## 2013-03-08 MED ORDER — GLIMEPIRIDE 4 MG PO TABS: 4.0000 mg | ORAL_TABLET | Freq: Two times a day (BID) | ORAL | Status: DC

## 2013-03-08 MED ORDER — METFORMIN HCL 500 MG PO TABS: ORAL_TABLET | ORAL | Status: DC

## 2013-03-08 MED ORDER — ATORVASTATIN CALCIUM 40 MG PO TABS: 40.0000 mg | ORAL_TABLET | Freq: Every day | ORAL | Status: DC

## 2013-03-08 MED ORDER — ESCITALOPRAM OXALATE 10 MG PO TABS: 10.0000 mg | ORAL_TABLET | Freq: Every day | ORAL | Status: DC

## 2013-03-08 MED ORDER — LEVOTHYROXINE SODIUM 100 MCG PO TABS: ORAL_TABLET | ORAL | Status: DC

## 2013-03-08 MED ORDER — AMLODIPINE BESY-BENAZEPRIL HCL 10-40 MG PO CAPS: ORAL_CAPSULE | ORAL | Status: DC

## 2013-03-08 NOTE — Patient Instructions (Signed)
Please consider shingles vaccine. She may want to check with her Medicare part D. to see about coverage. Shingles Vaccine What You Need to Know WHAT IS SHINGLES?  Shingles is a painful skin rash, often with blisters. It is also called Herpes Zoster or just Zoster.  A shingles rash usually appears on one side of the face or body and lasts from 2 to 4 weeks. Its main symptom is pain, which can be quite severe. Other symptoms of shingles can include fever, headache, chills, and upset stomach. Very rarely, a shingles infection can lead to pneumonia, hearing problems, blindness, brain inflammation (encephalitis), or death.  For about 1 person in 5, severe pain can continue even after the rash clears up. This is called post-herpetic neuralgia.  Shingles is caused by the Varicella Zoster virus. This is the same virus that causes chickenpox. Only someone who has had a case of chickenpox or rarely, has gotten chickenpox vaccine, can get shingles. The virus stays in your body. It can reappear many years later to cause a case of shingles.  You cannot catch shingles from another person with shingles. However, a person who has never had chickenpox (or chickenpox vaccine) could get chickenpox from someone with shingles. This is not very common.  Shingles is far more common in people 57 and older than in younger people. It is also more common in people whose immune systems are weakened because of a disease such as cancer or drugs such as steroids or chemotherapy.  At least 1 million people get shingles per year in the [place]. SHINGLES VACCINE  A vaccine for shingles was licensed in 2006. In clinical trials, the vaccine reduced the risk of shingles by 50%. It can also reduce the pain in people who still get shingles after being vaccinated.  A single dose of shingles vaccine is recommended for adults 70 years of age and older. SOME PEOPLE SHOULD NOT GET SHINGLES VACCINE OR SHOULD WAIT A person should  not get shingles vaccine if he or she:  Has ever had a life-threatening allergic reaction to gelatin, the antibiotic neomycin, or any other component of shingles vaccine. Tell your caregiver if you have any severe allergies.  Has a weakened immune system because of current:  AIDS or another disease that affects the immune system.  Treatment with drugs that affect the immune system, such as prolonged use of high-dose steroids.  Cancer treatment, such as radiation or chemotherapy.  Cancer affecting the bone marrow or lymphatic system, such as leukemia or lymphoma.  Is pregnant, or might be pregnant. Women should not become pregnant until at least 4 weeks after getting shingles vaccine. Someone with a minor illness, such as a cold, may be vaccinated. Anyone with a moderate or severe acute illness should usually wait until he or she recovers before getting the vaccine. This includes anyone with a temperature of 101.3 F (38 C) or higher. WHAT ARE THE RISKS FROM SHINGLES VACCINE?  A vaccine, like any medicine, could possibly cause serious problems, such as severe allergic reactions. However, the risk of a vaccine causing serious harm, or death, is extremely small.  No serious problems have been identified with shingles vaccine. Mild Problems  Redness, soreness, swelling, or itching at the site of the injection (about 1 person in 3).  Headache (about 1 person in 70). Like all vaccines, shingles vaccine is being closely monitored for unusual or severe problems. WHAT IF THERE IS A MODERATE OR SEVERE REACTION? What should I look for?  Any unusual condition, such as a severe allergic reaction or a high fever. If a severe allergic reaction occurred, it would be within a few minutes to an hour after the shot. Signs of a serious allergic reaction can include difficulty breathing, weakness, hoarseness or wheezing, a fast heartbeat, hives, dizziness, paleness, or swelling of the throat. What should I  do?  Call your caregiver, or get the person to a caregiver right away.  Tell the caregiver what happened, the date and time it happened, and when the vaccination was given.  Ask the caregiver to report the reaction by filing a Vaccine Adverse Event Reporting System (VAERS) form. Or, you can file this report through the VAERS web site at www.vaers.LAgents.no or by calling 1-(306) 239-3876. VAERS does not provide medical advice. HOW CAN I LEARN MORE?  Ask your caregiver. He or she can give you the vaccine package insert or suggest other sources of information.  Contact the Centers for Disease Control and Prevention (CDC):  Call (650)642-0738 (1-800-CDC-INFO).  Visit the CDC website at PicCapture.uy CDC Shingles Vaccine VIS (10/04/08) Document Released: 10/13/2006 Document Revised: 03/09/2012 Document Reviewed: 10/04/2008 [facility] Patient Information 2013 [place], [place].

## 2013-03-08 NOTE — Progress Notes (Signed)
  Subjective:    Patient ID: Gina [name], female    DOB: 12/04/1934, 79 y.o.   MRN: [id number]  HPI DM - Normally well controlled. No hypoglycemic events. No wounds that aren't healing well.  Lab Results  Component Value Date   HGBA1C 6.7 08/26/2012   HTN-  Pt denies chest pain, SOB, dizziness, or heart palpitations.  Taking meds as directed w/o problems.  Denies medication side effects.    Hyperlipidemia-tolerating statin well without any side effects or problems. Lab Results  Component Value Date   CHOL 128 05/04/2012   HDL 41 05/04/2012   LDLCALC 64 05/04/2012   TRIG 117 05/04/2012   CHOLHDL 3.1 05/04/2012   Hypothyroid - No weight, skin or hair changes.    She has been really stressed at home taking care of her husband who is now in hospice. He went to hospital about 2 weeks ago. He has cryptogenic cirrhosis and is near the end of his life. Did recommend home hospice. They agree to do this. She has felt a little overwhelmed and said her emotions are up and down. We had tried her on Prozac in the past but she says it made her too sleepy and worries about being able to have good cognitive function and driving with it. She does not take it currently. Sleep has been fair. Overall she's been adjusting to the changes quite well except the fact that her husband will likely pass away soon. Interestingly he is actually got a lot better since being home and being taken off of his chronic medications. Review of Systems     Objective:   Physical Exam  Constitutional: She is oriented to person, place, and time. She appears well-developed and well-nourished.  HENT:  Head: Normocephalic and atraumatic.  Cardiovascular: Normal rate and regular rhythm.   Pulmonary/Chest: Effort normal and breath sounds normal.  Neurological: She is alert and oriented to person, place, and time.  Skin: Skin is warm and dry.  Psychiatric: She has a normal mood and affect. Her behavior is normal.           Assessment & Plan:  DM- Well controlled.  F/U in 4 months.  On ACE, ASA, statin. F/U in 4 months.   Lab Results  Component Value Date   HGBA1C 6.3 03/08/2013    HTN- Well coontrolled.    Hyperlipidemia-continue statin. Due for repeat labs in may.  Hypothyroidism-Due to recheck TSH.    Acute situation stress - Will start Lexapro.  F/U in 3 moths.  Hopefully will help her deal with the emotional ups and downs with dealing with her husband who in on  Hospice.

## 2013-03-09 LAB — BASIC METABOLIC PANEL WITHOUT GFR
BUN: 13 mg/dL (ref 6–23)
CO2: 26 meq/L (ref 19–32)
Calcium: 9.8 mg/dL (ref 8.4–10.5)
Chloride: 105 meq/L (ref 96–112)
Creat: 0.8 mg/dL (ref 0.50–1.10)
GFR, Est African American: 83 mL/min
GFR, Est Non African American: 72 mL/min
Glucose, Bld: 180 mg/dL — ABNORMAL HIGH (ref 70–99)
Potassium: 4 meq/L (ref 3.5–5.3)
Sodium: 138 meq/L (ref 135–145)

## 2013-03-09 LAB — TSH: TSH: 3.019 u[IU]/mL (ref 0.350–4.500)

## 2013-07-06 ENCOUNTER — Other Ambulatory Visit: Payer: Self-pay | Admitting: Family Medicine

## 2013-07-08 ENCOUNTER — Ambulatory Visit (INDEPENDENT_AMBULATORY_CARE_PROVIDER_SITE_OTHER): Payer: Medicare Other | Admitting: Family Medicine

## 2013-07-08 ENCOUNTER — Encounter: Payer: Self-pay | Admitting: Family Medicine

## 2013-07-08 ENCOUNTER — Other Ambulatory Visit: Payer: Self-pay

## 2013-07-08 VITALS — BP 126/68 | HR 60 | Ht 65.0 in | Wt 165.0 lb

## 2013-07-08 DIAGNOSIS — I10 Essential (primary) hypertension: Secondary | ICD-10-CM | POA: Diagnosis not present

## 2013-07-08 DIAGNOSIS — E119 Type 2 diabetes mellitus without complications: Secondary | ICD-10-CM | POA: Diagnosis not present

## 2013-07-08 DIAGNOSIS — E039 Hypothyroidism, unspecified: Secondary | ICD-10-CM | POA: Diagnosis not present

## 2013-07-08 DIAGNOSIS — F4321 Adjustment disorder with depressed mood: Secondary | ICD-10-CM

## 2013-07-08 LAB — POCT GLYCOSYLATED HEMOGLOBIN (HGB A1C): Hemoglobin A1C: 7

## 2013-07-08 MED ORDER — NITROGLYCERIN 0.4 MG SL SUBL: 0.4000 mg | SUBLINGUAL_TABLET | SUBLINGUAL | Status: DC | PRN

## 2013-07-08 NOTE — Patient Instructions (Addendum)
Try to get eye exam sometime this summer.

## 2013-07-08 NOTE — Progress Notes (Signed)
  Subjective:    Patient ID: Gina [name], female    DOB: 1934-04-27, 79 y.o.   MRN: [id number]  HPI DM- No hypoglycemic events. No wounds that aren't healing well.  Taking her metfomin.    Hypothyroid - No recent skin or hair changes. No weight changes.   Depression-she lost her husband recently. Overall she's doing well. She was started on Lexapro and has tolerated it well without any side effects her palms. She does feel like it has really helped her get through this time in her life. She has a lot of family support. In fact they are spending the night with her. She has another daughter is flying and is going to send the next week with her.  Review of Systems     Objective:   Physical Exam  Constitutional: She is oriented to person, place, and time. She appears well-developed and well-nourished.  HENT:  Head: Normocephalic and atraumatic.  Cardiovascular: Normal rate, regular rhythm and normal heart sounds.   Pulmonary/Chest: Effort normal and breath sounds normal.  Neurological: She is alert and oriented to person, place, and time.  Skin: Skin is warm and dry.  Psychiatric: She has a normal mood and affect. Her behavior is normal.          Assessment & Plan:  DM - Well controlled.  On ACE, asa, statin.  F/U in 3 months.  Continue current regimen. Lab Results  Component Value Date   HGBA1C 7.0 07/08/2013   Hypothyroid - will check TSH. Call with results once available.  Depression - doing well on Lexapro.  PHQ- 9 score of 3.  Continue current regimen. Followup in 3-4 months. Reminded her that this medication is temporary as long as she feels that she needs something to help with her made. She does have a lot of support from her family while she is grieving.

## 2013-07-13 ENCOUNTER — Telehealth: Payer: Self-pay | Admitting: Family Medicine

## 2013-07-13 NOTE — Telephone Encounter (Signed)
Please call patient: Her to get a copy of her colonoscopy report from [facility]. Her last one was 6 years ago. Because of her history of colon cancer then we should get her referred for another. I think 5 years is the maximum. We can get her referred here locally if she would like. Please let me know.

## 2013-07-13 NOTE — Telephone Encounter (Signed)
Records obtained.Gina [name]

## 2013-07-19 DIAGNOSIS — E039 Hypothyroidism, unspecified: Secondary | ICD-10-CM | POA: Diagnosis not present

## 2013-07-19 DIAGNOSIS — I10 Essential (primary) hypertension: Secondary | ICD-10-CM | POA: Diagnosis not present

## 2013-07-19 DIAGNOSIS — E119 Type 2 diabetes mellitus without complications: Secondary | ICD-10-CM | POA: Diagnosis not present

## 2013-07-19 LAB — COMPLETE METABOLIC PANEL WITH GFR
ALT: 17 U/L (ref 0–35)
CO2: 24 mEq/L (ref 19–32)
Creat: 0.84 mg/dL (ref 0.50–1.10)
GFR, Est African American: 78 mL/min
GFR, Est Non African American: 68 mL/min
Glucose, Bld: 162 mg/dL — ABNORMAL HIGH (ref 70–99)
Total Bilirubin: 0.3 mg/dL (ref 0.3–1.2)

## 2013-07-19 LAB — LIPID PANEL
HDL: 41 mg/dL (ref >39)
Triglycerides: 149 mg/dL (ref <150)

## 2013-07-19 LAB — TSH: TSH: 0.992 u[IU]/mL (ref 0.350–4.500)

## 2013-07-20 ENCOUNTER — Encounter: Payer: Self-pay | Admitting: Family Medicine

## 2013-07-20 NOTE — Progress Notes (Signed)
Quick Note:  All labs are normal. ______ 

## 2013-08-06 ENCOUNTER — Other Ambulatory Visit: Payer: Self-pay | Admitting: Family Medicine

## 2013-09-06 ENCOUNTER — Other Ambulatory Visit: Payer: Self-pay | Admitting: Family Medicine

## 2013-10-01 ENCOUNTER — Other Ambulatory Visit: Payer: Self-pay | Admitting: *Deleted

## 2013-10-01 MED ORDER — ALPRAZOLAM 0.5 MG PO TABS: 0.5000 mg | ORAL_TABLET | Freq: Every day | ORAL | Status: AC | PRN

## 2013-10-01 MED ORDER — GLIMEPIRIDE 4 MG PO TABS: 4.0000 mg | ORAL_TABLET | Freq: Two times a day (BID) | ORAL | Status: DC

## 2013-10-01 MED ORDER — METFORMIN HCL 500 MG PO TABS: ORAL_TABLET | ORAL | Status: DC

## 2013-10-14 ENCOUNTER — Encounter: Payer: Self-pay | Admitting: Family Medicine

## 2013-10-14 ENCOUNTER — Ambulatory Visit (INDEPENDENT_AMBULATORY_CARE_PROVIDER_SITE_OTHER): Payer: Medicare Other | Admitting: Family Medicine

## 2013-10-14 VITALS — BP 115/73 | HR 64 | Wt 165.0 lb

## 2013-10-14 DIAGNOSIS — E785 Hyperlipidemia, unspecified: Secondary | ICD-10-CM | POA: Diagnosis not present

## 2013-10-14 DIAGNOSIS — Z720 Tobacco use: Secondary | ICD-10-CM

## 2013-10-14 DIAGNOSIS — H3530 Unspecified macular degeneration: Secondary | ICD-10-CM | POA: Insufficient documentation

## 2013-10-14 DIAGNOSIS — I10 Essential (primary) hypertension: Secondary | ICD-10-CM

## 2013-10-14 DIAGNOSIS — N951 Menopausal and female climacteric states: Secondary | ICD-10-CM

## 2013-10-14 DIAGNOSIS — E119 Type 2 diabetes mellitus without complications: Secondary | ICD-10-CM

## 2013-10-14 DIAGNOSIS — Z23 Encounter for immunization: Secondary | ICD-10-CM | POA: Diagnosis not present

## 2013-10-14 DIAGNOSIS — F4321 Adjustment disorder with depressed mood: Secondary | ICD-10-CM

## 2013-10-14 DIAGNOSIS — I2510 Atherosclerotic heart disease of native coronary artery without angina pectoris: Secondary | ICD-10-CM | POA: Diagnosis not present

## 2013-10-14 LAB — POCT GLYCOSYLATED HEMOGLOBIN (HGB A1C): Hemoglobin A1C: 6.7

## 2013-10-14 MED ORDER — AMBULATORY NON FORMULARY MEDICATION: Status: AC

## 2013-10-14 NOTE — Patient Instructions (Signed)
Please get your diabetic eye exam!! 

## 2013-10-14 NOTE — Progress Notes (Signed)
Subjective:    Patient ID: Gina [name], female    DOB: May 12, 1934, 79 y.o.   MRN: [id number]  HPI DM- no hypoglycemic events.  No wounds that aren't healing well.  On metformin and glimerpiride.    HTN-  Pt denies chest pain, SOB, dizziness, or heart palpitations.  Taking meds as directed w/o problems.  Denies medication side effects.    CAD - No CP or SOB.  Does smoke.   Grieving-Notice and her xanax brand had changed and it makes her more sleepy.  Same dose but still sleepy. She says she plans to wean off of it and be off of it by January. She feels like overall she's actually doing really well with the loss of her husband earlier this year. She feels like mentally she is in a good place and she has great support from her family.  Tobacco abuse-she is still smoking. She says in fact she's actually been sleeping a little bit more since her husband died earlier this year.  Review of Systems  BP 115/73  Pulse 64  Wt 165 lb (74.844 kg)  BMI 27.46 kg/m2    Allergies  Allergen Reactions  . Chlorthalidone Other (See Comments)    Extreme fatigue and weakness.   . Metoprolol Other (See Comments)    Dizziness.   . Sulfonamide Derivatives     Past Medical History  Diagnosis Date  . HTN (hypertension)   . T2DM (type 2 diabetes mellitus)   . High cholesterol   . Hypothyroidism   . CAD (coronary artery disease)   . Colon cancer     Chemotherapy    Past Surgical History  Procedure Laterality Date  . Cabg      1998.   . Coronary stent placement  2008  . Laparoscopic cholecystectomy    . Colectomy  2003    for cancer. partial  . Appendectomy    . Cataract extraction, bilateral      History   Social History  . Marital Status: Married    Spouse Name: N/A    Number of Children: 4  . Years of Education: N/A   Occupational History  .      Retired   Social History Main Topics  . Smoking status: Current Every Day Smoker    Types: Cigarettes  . Smokeless tobacco:  Not on file     Comment: 5 cigarettes/day   . Alcohol Use: Yes     Comment: Occasional glass of wine  . Drug Use: Not on file  . Sexual Activity: Not on file   Other Topics Concern  . Not on file   Social History Narrative   Married, 4 grown kids. Moved from [place], no regular exercise.     Family History  Problem Relation Age of Onset  . Diabetes Mother   . Coronary artery disease Father     Died of MI at age 75    Outpatient Encounter Prescriptions as of 10/14/2013  Medication Sig Dispense Refill  . ALPRAZolam (XANAX) 0.5 MG tablet Take 1 tablet (0.5 mg total) by mouth daily as needed for anxiety.  30 tablet  0  . AMBULATORY NON FORMULARY MEDICATION Medication Name: Reli-On Ultia blood glucose test strip. Test 1 x a day.  Dx. 250.00  1 Stick  11  . AMBULATORY NON FORMULARY MEDICATION Medication Name: Zostavax IM x 1 .  1 vial  0  . amLODipine-benazepril (LOTREL) 10-40 MG per capsule TAKE ONE CAPSULE BY MOUTH EVERY DAY  90 capsule  2  . aspirin 325 MG tablet Take 325 mg by mouth daily.        Marland Kitchen atorvastatin (LIPITOR) 40 MG tablet Take 1 tablet (40 mg total) by mouth at bedtime.  90 tablet  2  . glimepiride (AMARYL) 4 MG tablet Take 1 tablet (4 mg total) by mouth 2 (two) times daily.  180 tablet  1  . levothyroxine (SYNTHROID, LEVOTHROID) 100 MCG tablet TAKE ONE TABLET BY MOUTH ONCE DAILY  90 tablet  0  . metFORMIN (GLUCOPHAGE) 500 MG tablet TAKE ONE TABLET BY MOUTH TWICE DAILY WITH A MEAL.  180 tablet  1  . nitroGLYCERIN (NITROSTAT) 0.4 MG SL tablet Place 1 tablet (0.4 mg total) under the tongue every 5 (five) minutes as needed.  30 tablet  1  . RELION ULTIMA TEST test strip USE ONE  EVERY DAY  50 each  10  . AMBULATORY NON FORMULARY MEDICATION Medication Name: Glucose meter strips ( [employer] brand). Test once a day . Dx. 250.0  1 Units  PRN  . [DISCONTINUED] escitalopram (LEXAPRO) 10 MG tablet TAKE ONE TABLET BY MOUTH ONCE DAILY  30 tablet  3   No facility-administered  encounter medications on file as of 10/14/2013.          Objective:   Physical Exam  Constitutional: She is oriented to person, place, and time. She appears well-developed and well-nourished.  HENT:  Head: Normocephalic and atraumatic.  Neck: Neck supple. No thyromegaly present.  Cardiovascular: Normal rate, regular rhythm and normal heart sounds.   No carotid bruits  Pulmonary/Chest: Effort normal and breath sounds normal.  Musculoskeletal: She exhibits no edema.  Lymphadenopathy:    She has no cervical adenopathy.  Neurological: She is alert and oriented to person, place, and time.  Skin: Skin is warm and dry.  Psychiatric: She has a normal mood and affect. Her behavior is normal.          Assessment & Plan:  DM- Well controlled. A1C 6.7 looks great.  Due for diabetic eye exam. We'll make a referral for her. Has been well over a year. Followup in about 3 months. Call if any palms or concerns in the meantime. She will be due for BMP at followup visit but does not need to fast for that.  Due for urine micro as aell.   HTN- well controlled. Continue current regimen. Followup in 3 months.  Coronary artery disease-she is on a statin, aspirin. Blood pressures well controlled.. Diabetes well controlled. Intolerant to beta blockers.  Tob abuse  - encouraged cessation. Continue work on cutting back.  Grieving-overall she really is doing well. She looks great today and is taking good care of herself. She has a good outlook and positive attitude. She has a good supportive family.  Flu vaccine given today.

## 2013-10-15 LAB — POCT UA - MICROALBUMIN: Microalbumin Ur, POC: 10 mg/L

## 2013-10-15 NOTE — Addendum Note (Signed)
Addended by: Deno Etienne on: 10/15/2013 09:13 AM   Modules accepted: Orders

## 2013-12-08 ENCOUNTER — Telehealth: Payer: Self-pay | Admitting: Family Medicine

## 2013-12-08 DIAGNOSIS — E119 Type 2 diabetes mellitus without complications: Secondary | ICD-10-CM

## 2013-12-08 DIAGNOSIS — E1059 Type 1 diabetes mellitus with other circulatory complications: Secondary | ICD-10-CM

## 2013-12-08 NOTE — Telephone Encounter (Signed)
Patient called and request to have a referral to an ophthalmologist for her eyes and request to have appointment if possible before the end of the year for insurance purposes and she will go with whomever has the next available appointment. Thanks

## 2013-12-08 NOTE — Telephone Encounter (Signed)
Please let her referral has been placed. We'll schedule her with [facility] as surgeons.

## 2013-12-14 ENCOUNTER — Other Ambulatory Visit: Payer: Self-pay | Admitting: Family Medicine

## 2013-12-16 NOTE — Telephone Encounter (Signed)
Pt informed.  Towanna Avery, LPN  

## 2014-01-06 ENCOUNTER — Other Ambulatory Visit: Payer: Self-pay | Admitting: Family Medicine

## 2014-01-20 ENCOUNTER — Other Ambulatory Visit: Payer: Self-pay | Admitting: *Deleted

## 2014-01-20 ENCOUNTER — Ambulatory Visit (INDEPENDENT_AMBULATORY_CARE_PROVIDER_SITE_OTHER): Payer: Medicare Other | Admitting: Family Medicine

## 2014-01-20 ENCOUNTER — Encounter: Payer: Self-pay | Admitting: Family Medicine

## 2014-01-20 VITALS — BP 125/67 | HR 61 | Temp 97.3°F | Ht 64.6 in | Wt 159.0 lb

## 2014-01-20 DIAGNOSIS — I2510 Atherosclerotic heart disease of native coronary artery without angina pectoris: Secondary | ICD-10-CM

## 2014-01-20 DIAGNOSIS — I10 Essential (primary) hypertension: Secondary | ICD-10-CM | POA: Diagnosis not present

## 2014-01-20 DIAGNOSIS — H3530 Unspecified macular degeneration: Secondary | ICD-10-CM | POA: Diagnosis not present

## 2014-01-20 DIAGNOSIS — E119 Type 2 diabetes mellitus without complications: Secondary | ICD-10-CM | POA: Diagnosis not present

## 2014-01-20 LAB — BASIC METABOLIC PANEL WITH GFR
BUN: 9 mg/dL (ref 6–23)
CO2: 24 meq/L (ref 19–32)
CREATININE: 0.74 mg/dL (ref 0.50–1.10)
Calcium: 9.6 mg/dL (ref 8.4–10.5)
Chloride: 109 mEq/L (ref 96–112)
GFR, Est African American: >89 mL/min
GFR, Est Non African American: 78 mL/min
GLUCOSE: 157 mg/dL — AB (ref 70–99)
Potassium: 4.6 mEq/L (ref 3.5–5.3)
SODIUM: 138 meq/L (ref 135–145)

## 2014-01-20 LAB — POCT GLYCOSYLATED HEMOGLOBIN (HGB A1C): Hemoglobin A1C: 6.1

## 2014-01-20 MED ORDER — ALPRAZOLAM 0.5 MG PO TABS: 0.5000 mg | ORAL_TABLET | Freq: Every day | ORAL | Status: DC | PRN

## 2014-01-20 MED ORDER — GLIMEPIRIDE 4 MG PO TABS: 4.0000 mg | ORAL_TABLET | Freq: Two times a day (BID) | ORAL | Status: DC

## 2014-01-20 MED ORDER — LEVOTHYROXINE SODIUM 100 MCG PO TABS: ORAL_TABLET | ORAL | Status: DC

## 2014-01-20 MED ORDER — GLUCOSE BLOOD VI STRP: ORAL_STRIP | Status: DC

## 2014-01-20 NOTE — Progress Notes (Signed)
Quick Note:  All labs are normal. ______ 

## 2014-01-20 NOTE — Patient Instructions (Signed)
Smoking Cessation Quitting smoking is important to your health and has many advantages. However, it is not always easy to quit since nicotine is a very addictive drug. Often times, people try 3 times or more before being able to quit. This document explains the best ways for you to prepare to quit smoking. Quitting takes hard work and a lot of effort, but you can do it. ADVANTAGES OF QUITTING SMOKING  You will live longer, feel better, and live better.  Your body will feel the impact of quitting smoking almost immediately.  Within 20 minutes, blood pressure decreases. Your pulse returns to its normal level.  After 8 hours, carbon monoxide levels in the blood return to normal. Your oxygen level increases.  After 24 hours, the chance of having a heart attack starts to decrease. Your breath, hair, and body stop smelling like smoke.  After 48 hours, damaged nerve endings begin to recover. Your sense of taste and smell improve.  After 72 hours, the body is virtually free of nicotine. Your bronchial tubes relax and breathing becomes easier.  After 2 to 12 weeks, lungs can hold more air. Exercise becomes easier and circulation improves.  The risk of having a heart attack, stroke, cancer, or lung disease is greatly reduced.  After 1 year, the risk of coronary heart disease is cut in half.  After 5 years, the risk of stroke falls to the same as a nonsmoker.  After 10 years, the risk of lung cancer is cut in half and the risk of other cancers decreases significantly.  After 15 years, the risk of coronary heart disease drops, usually to the level of a nonsmoker.  If you are pregnant, quitting smoking will improve your chances of having a healthy baby.  The people you live with, especially any children, will be healthier.  You will have extra money to spend on things other than cigarettes. QUESTIONS TO THINK ABOUT BEFORE ATTEMPTING TO QUIT You may want to talk about your answers with your  caregiver.  Why do you want to quit?  If you tried to quit in the past, what helped and what did not?  What will be the most difficult situations for you after you quit? How will you plan to handle them?  Who can help you through the tough times? Your family? Friends? A caregiver?  What pleasures do you get from smoking? What ways can you still get pleasure if you quit? Here are some questions to ask your caregiver:  How can you help me to be successful at quitting?  What medicine do you think would be best for me and how should I take it?  What should I do if I need more help?  What is smoking withdrawal like? How can I get information on withdrawal? GET READY  Set a quit date.  Change your environment by getting rid of all cigarettes, ashtrays, matches, and lighters in your home, car, or work. Do not let people smoke in your home.  Review your past attempts to quit. Think about what worked and what did not. GET SUPPORT AND ENCOURAGEMENT You have a better chance of being successful if you have help. You can get support in many ways.  Tell your family, friends, and co-workers that you are going to quit and need their support. Ask them not to smoke around you.  Get individual, group, or telephone counseling and support. Programs are available at local hospitals and health centers. Call your local health department for   information about programs in your area.  Spiritual beliefs and practices may help some smokers quit.  Download a "quit meter" on your computer to keep track of quit statistics, such as how long you have gone without smoking, cigarettes not smoked, and money saved.  Get a self-help book about quitting smoking and staying off of tobacco. LEARN NEW SKILLS AND BEHAVIORS  Distract yourself from urges to smoke. Talk to someone, go for a walk, or occupy your time with a task.  Change your normal routine. Take a different route to work. Drink tea instead of coffee.  Eat breakfast in a different place.  Reduce your stress. Take a hot bath, exercise, or read a book.  Plan something enjoyable to do every day. Reward yourself for not smoking.  Explore interactive web-based programs that specialize in helping you quit. GET MEDICINE AND USE IT CORRECTLY Medicines can help you stop smoking and decrease the urge to smoke. Combining medicine with the above behavioral methods and support can greatly increase your chances of successfully quitting smoking.  Nicotine replacement therapy helps deliver nicotine to your body without the negative effects and risks of smoking. Nicotine replacement therapy includes nicotine gum, lozenges, inhalers, nasal sprays, and skin patches. Some may be available over-the-counter and others require a prescription.  Antidepressant medicine helps people abstain from smoking, but how this works is unknown. This medicine is available by prescription.  Nicotinic receptor partial agonist medicine simulates the effect of nicotine in your brain. This medicine is available by prescription. Ask your caregiver for advice about which medicines to use and how to use them based on your health history. Your caregiver will tell you what side effects to look out for if you choose to be on a medicine or therapy. Carefully read the information on the package. Do not use any other product containing nicotine while using a nicotine replacement product.  RELAPSE OR DIFFICULT SITUATIONS Most relapses occur within the first 3 months after quitting. Do not be discouraged if you start smoking again. Remember, most people try several times before finally quitting. You may have symptoms of withdrawal because your body is used to nicotine. You may crave cigarettes, be irritable, feel very hungry, cough often, get headaches, or have difficulty concentrating. The withdrawal symptoms are only temporary. They are strongest when you first quit, but they will go away within  10 14 days. To reduce the chances of relapse, try to:  Avoid drinking alcohol. Drinking lowers your chances of successfully quitting.  Reduce the amount of caffeine you consume. Once you quit smoking, the amount of caffeine in your body increases and can give you symptoms, such as a rapid heartbeat, sweating, and anxiety.  Avoid smokers because they can make you want to smoke.  Do not let weight gain distract you. Many smokers will gain weight when they quit, usually less than 10 pounds. Eat a healthy diet and stay active. You can always lose the weight gained after you quit.  Find ways to improve your mood other than smoking. FOR MORE INFORMATION  www.smokefree.gov  Document Released: 12/10/2001 Document Revised: 06/16/2012 Document Reviewed: 03/26/2012 [employer] Patient Information 2014 [employer].  

## 2014-01-20 NOTE — Progress Notes (Signed)
   Subjective:    Patient ID: Gina [name], female    DOB: 08-Feb-1934, 80 y.o.   MRN: [id number]  HPI Diabetes - Only one hypoglycemic event in 3 months. Felt shakey and then ate meter. No wounds or sores that are not healing well. No increased thirst or urination. Checking glucose at home. Taking medications as prescribed without any side effects. Still has not scheduled her eye exam. Needs rx for test strips.    Hypertension- Pt denies chest pain, SOB, dizziness, or heart palpitations.  Taking meds as directed w/o problems.  Denies medication side effects.    Anxiety-would like a refill on her Xanax today. She had questions about whether not she should take it daily or not.  Macular degeneration-her last eye exam was a most 4-5 years ago. We have reminded her multiple locations to try make an eye appointment. We offered to schedule her for her today and she accepted. She has noticed some decline in her vision the last year or 2.     Review of Systems     Objective:   Physical Exam  Constitutional: She is oriented to person, place, and time. She appears well-developed and well-nourished.  HENT:  Head: Normocephalic and atraumatic.  Cardiovascular: Normal rate, regular rhythm and normal heart sounds.   Pulmonary/Chest: Effort normal and breath sounds normal.  Neurological: She is alert and oriented to person, place, and time.  Skin: Skin is warm and dry.  Psychiatric: She has a normal mood and affect. Her behavior is normal.          Assessment & Plan:  DM- Well controlled.  On statin, ASA, and ACE. Implement A1c is 6.1 today. Well controlled. Continue current regimen.  Needs strips for the Reli-on.   HTN- Well controlled.  Continue current regimen.   Anxiety - Discussed risk of using xanax, inc risk of falls and alzheimers.  She decided didn't want a refill on it.  Order had been printed but we voided it and discarded it.   Macular degeneration.- will refer to ophtho.    CAD- On statin, ASA.  Not on BB.   Due for bone density test. She says she will think about it and let me know next time I see her. She doesn't want to do it right now.

## 2014-02-23 DIAGNOSIS — H47239 Glaucomatous optic atrophy, unspecified eye: Secondary | ICD-10-CM | POA: Diagnosis not present

## 2014-02-23 DIAGNOSIS — Z9849 Cataract extraction status, unspecified eye: Secondary | ICD-10-CM | POA: Diagnosis not present

## 2014-02-23 DIAGNOSIS — H353190 Nonexudative age-related macular degeneration, unspecified eye, stage unspecified: Secondary | ICD-10-CM | POA: Diagnosis not present

## 2014-03-12 ENCOUNTER — Other Ambulatory Visit: Payer: Self-pay | Admitting: Family Medicine

## 2014-04-06 ENCOUNTER — Other Ambulatory Visit: Payer: Self-pay | Admitting: Family Medicine

## 2014-04-18 ENCOUNTER — Encounter: Payer: Self-pay | Admitting: Family Medicine

## 2014-04-18 ENCOUNTER — Ambulatory Visit (INDEPENDENT_AMBULATORY_CARE_PROVIDER_SITE_OTHER): Payer: Medicare Other | Admitting: Family Medicine

## 2014-04-18 VITALS — BP 124/61 | HR 60 | Wt 154.0 lb

## 2014-04-18 DIAGNOSIS — I2510 Atherosclerotic heart disease of native coronary artery without angina pectoris: Secondary | ICD-10-CM | POA: Diagnosis not present

## 2014-04-18 DIAGNOSIS — I10 Essential (primary) hypertension: Secondary | ICD-10-CM

## 2014-04-18 DIAGNOSIS — E119 Type 2 diabetes mellitus without complications: Secondary | ICD-10-CM | POA: Diagnosis not present

## 2014-04-18 DIAGNOSIS — H9209 Otalgia, unspecified ear: Secondary | ICD-10-CM | POA: Diagnosis not present

## 2014-04-18 DIAGNOSIS — H9202 Otalgia, left ear: Secondary | ICD-10-CM

## 2014-04-18 LAB — POCT GLYCOSYLATED HEMOGLOBIN (HGB A1C): Hemoglobin A1C: 6.3

## 2014-04-18 MED ORDER — AMBULATORY NON FORMULARY MEDICATION: Status: AC

## 2014-04-18 NOTE — Patient Instructions (Signed)
Recommend trial of claritin for the runny nose and ear pain

## 2014-04-18 NOTE — Progress Notes (Signed)
   Subjective:    Patient ID: Gina [name], female    DOB: [birth year], 80 y.o.   MRN: [id number]  HPI Diabetes - no hypoglycemic events. No wounds or sores that are not healing well. No increased thirst or urination. Checking glucose at home. Taking medications as prescribed without any side effects. She did go for her eye exam recently and was told that her macular degeneration is mild but is maybe a little bit worse. They're not doing intervention at this time.  Hypertension- Pt denies chest pain, SOB, dizziness, or heart palpitations.  Taking meds as directed w/o problems.  Denies medication side effects.    Ear pain - left. Strted with feeling both ears were plugged.  But pain became severe in the left ear that was radiating to her jaw. No drinage. Just had dentist check up.  No no fever chills or sweats    Review of Systems     Objective:   Physical Exam  Constitutional: She is oriented to person, place, and time. She appears well-developed and well-nourished.  HENT:  Head: Normocephalic and atraumatic.  Right Ear: External ear normal.  Left Ear: External ear normal.  Nose: Nose normal.  Mouth/Throat: Oropharynx is clear and moist.  TMs and canals are clear.   Eyes: Conjunctivae and EOM are normal. Pupils are equal, round, and reactive to light.  Neck: Neck supple. No thyromegaly present.  Cardiovascular: Normal rate, regular rhythm and normal heart sounds.   Pulmonary/Chest: Effort normal and breath sounds normal. She has no wheezes.  Lymphadenopathy:    She has no cervical adenopathy.  Neurological: She is alert and oriented to person, place, and time.  Skin: Skin is warm and dry.  Psychiatric: She has a normal mood and affect.          Assessment & Plan:  DM - Well controlled. Continue current regimen. Eye exam is UTD. Will call for report.  Needs new order for strips. F/U in 3 months and see me with yearly eye exams. Lab Results  Component Value Date   HGBA1C  6.3 04/18/2014   HTN- Well controlled.  Contiue regimen.    Left ear pain - eye exam is normal. Recommend triual of OTC claritin. Call if pain gets worse. She is leaving in about 2 weeks to fly to see her daughter.

## 2014-04-20 ENCOUNTER — Ambulatory Visit: Payer: Medicare Other | Admitting: Family Medicine

## 2014-06-12 ENCOUNTER — Other Ambulatory Visit: Payer: Self-pay | Admitting: Family Medicine

## 2014-07-11 ENCOUNTER — Other Ambulatory Visit: Payer: Self-pay | Admitting: Family Medicine

## 2014-07-27 ENCOUNTER — Encounter: Payer: Self-pay | Admitting: Family Medicine

## 2014-07-27 ENCOUNTER — Ambulatory Visit (INDEPENDENT_AMBULATORY_CARE_PROVIDER_SITE_OTHER): Payer: Medicare Other | Admitting: Family Medicine

## 2014-07-27 VITALS — BP 114/63 | HR 62 | Wt 150.0 lb

## 2014-07-27 DIAGNOSIS — I10 Essential (primary) hypertension: Secondary | ICD-10-CM

## 2014-07-27 DIAGNOSIS — Z23 Encounter for immunization: Secondary | ICD-10-CM

## 2014-07-27 DIAGNOSIS — F32A Depression, unspecified: Secondary | ICD-10-CM

## 2014-07-27 DIAGNOSIS — F329 Major depressive disorder, single episode, unspecified: Secondary | ICD-10-CM | POA: Diagnosis not present

## 2014-07-27 DIAGNOSIS — I2510 Atherosclerotic heart disease of native coronary artery without angina pectoris: Secondary | ICD-10-CM

## 2014-07-27 DIAGNOSIS — F3289 Other specified depressive episodes: Secondary | ICD-10-CM | POA: Diagnosis not present

## 2014-07-27 DIAGNOSIS — E039 Hypothyroidism, unspecified: Secondary | ICD-10-CM | POA: Diagnosis not present

## 2014-07-27 DIAGNOSIS — E119 Type 2 diabetes mellitus without complications: Secondary | ICD-10-CM | POA: Diagnosis not present

## 2014-07-27 LAB — POCT GLYCOSYLATED HEMOGLOBIN (HGB A1C): Hemoglobin A1C: 6.2

## 2014-07-27 MED ORDER — ESCITALOPRAM OXALATE 10 MG PO TABS: ORAL_TABLET | ORAL | Status: DC

## 2014-07-27 MED ORDER — GLIMEPIRIDE 4 MG PO TABS: 4.0000 mg | ORAL_TABLET | Freq: Two times a day (BID) | ORAL | Status: DC

## 2014-07-27 NOTE — Progress Notes (Signed)
   Subjective:    Patient ID: Gina [name], female    DOB: 1934/12/10, 80 y.o.   MRN: [id number]  HPI Diabetes - no hypoglycemic events. No wounds or sores that are not healing well. No increased thirst or urination. Checking glucose at home. Taking medications as prescribed without any side effects.  Hypertension- Pt denies chest pain, SOB, dizziness, or heart palpitations.  Taking meds as directed w/o problems.  Denies medication side effects.    Hypothyroid - no recent skin or hair changes. No changes in weight or mood.  Lab Results  Component Value Date   TSH 0.992 07/19/2013   Depression/Grieving - says has been very emotional and tearful.  She thinks she may have come off her lexapro a little too soon.   Review of Systems     Objective:   Physical Exam  Constitutional: She is oriented to person, place, and time. She appears well-developed and well-nourished.  HENT:  Head: Normocephalic and atraumatic.  Cardiovascular: Normal rate, regular rhythm and normal heart sounds.   Pulmonary/Chest: Effort normal and breath sounds normal.  Neurological: She is alert and oriented to person, place, and time.  Skin: Skin is warm and dry.  Psychiatric: She has a normal mood and affect. Her behavior is normal.          Assessment & Plan:  DM- current regimen. A1c is 6.2. She is due for fasting lipid panel and CMP. Recommended getting an up-to-date eye exam. We'll call to get last eye exam report. followup in 3 months. She's due for lipids and a CMP. Encouraged her to try to get in the next couple weeks if at all possible. She's middle of moving to [place] says that she may have to wait to do it.  HTN - controlled on current regimen.  Depression/Greiving - will restart lexapro.  She tolerated it well in the past. So to see her back in 3 months to make sure that she's doing well. She can certainly consider therapy or counseling if she feels like it would be helpful.  Hypothyroid -  Due to recheck TSH.  Lab slip given.  Prevnar 13 given today.

## 2014-08-05 ENCOUNTER — Telehealth: Payer: Self-pay | Admitting: *Deleted

## 2014-08-05 NOTE — Telephone Encounter (Signed)
Pt called and stated that she feels that the restart of the lexapro is to strong and would like for Dr. Madilyn Fireman to switch her back to xanax. Marland KitchenMaryruth [name], [place] Crocker

## 2014-08-08 NOTE — Telephone Encounter (Signed)
Called and informed pt of recommendations .Gina [name]  

## 2014-08-08 NOTE — Telephone Encounter (Signed)
Recommend cut tab in half. Maybe whole tab was too aggressive.   She has been on it before and will need it for the longterm . Daily xanax is not the correct traetment.  But she can still use her xanax prn but I would still rec something daily. Dont forget it can increase anxiety for the first 2 weeks until starts to kick in.

## 2014-09-10 ENCOUNTER — Other Ambulatory Visit: Payer: Self-pay | Admitting: Family Medicine

## 2014-09-21 ENCOUNTER — Other Ambulatory Visit: Payer: Self-pay | Admitting: Family Medicine

## 2014-09-30 ENCOUNTER — Other Ambulatory Visit: Payer: Self-pay

## 2014-09-30 DIAGNOSIS — E039 Hypothyroidism, unspecified: Secondary | ICD-10-CM

## 2014-09-30 MED ORDER — LEVOTHYROXINE SODIUM 100 MCG PO TABS: ORAL_TABLET | ORAL | Status: DC

## 2014-10-15 ENCOUNTER — Other Ambulatory Visit: Payer: Self-pay | Admitting: Family Medicine

## 2014-10-27 ENCOUNTER — Encounter: Payer: Self-pay | Admitting: Family Medicine

## 2014-10-27 ENCOUNTER — Ambulatory Visit (INDEPENDENT_AMBULATORY_CARE_PROVIDER_SITE_OTHER): Payer: Medicare Other | Admitting: Family Medicine

## 2014-10-27 VITALS — BP 119/67 | HR 52 | Temp 98.6°F | Wt 148.0 lb

## 2014-10-27 DIAGNOSIS — Z23 Encounter for immunization: Secondary | ICD-10-CM | POA: Diagnosis not present

## 2014-10-27 DIAGNOSIS — I2510 Atherosclerotic heart disease of native coronary artery without angina pectoris: Secondary | ICD-10-CM | POA: Diagnosis not present

## 2014-10-27 DIAGNOSIS — E119 Type 2 diabetes mellitus without complications: Secondary | ICD-10-CM

## 2014-10-27 DIAGNOSIS — E039 Hypothyroidism, unspecified: Secondary | ICD-10-CM | POA: Diagnosis not present

## 2014-10-27 DIAGNOSIS — I10 Essential (primary) hypertension: Secondary | ICD-10-CM

## 2014-10-27 LAB — POCT GLYCOSYLATED HEMOGLOBIN (HGB A1C): HEMOGLOBIN A1C: 6.3

## 2014-10-27 NOTE — Progress Notes (Signed)
   Subjective:    Patient ID: Gina [name], female    DOB: 02-25-1934, 80 y.o.   MRN: [id number]  Diabetes   Hypertension- Pt denies chest pain, SOB, dizziness, or heart palpitations.  Taking meds as directed w/o problems.  Denies medication side effects.  She did go for her blood work today that we had ordered in July. She recently moved to a new apartment and loves it. She says she is happy as she's been in quite some time.  Diabetes - no hypoglycemic events. No wounds or sores that are not healing well. No increased thirst or urination. Checking glucose at home. Taking medications as prescribed without any side effects.  Review of Systems     Objective:   Physical Exam  Constitutional: She is oriented to person, place, and time. She appears well-developed and well-nourished.  HENT:  Head: Normocephalic and atraumatic.  Cardiovascular: Normal rate, regular rhythm and normal heart sounds.   Pulmonary/Chest: Effort normal and breath sounds normal.  Neurological: She is alert and oriented to person, place, and time.  Skin: Skin is warm and dry.  Psychiatric: She has a normal mood and affect. Her behavior is normal.          Assessment & Plan:  Hypertension- Pt denies chest pain, SOB, dizziness, or heart palpitations.  Taking meds as directed w/o problems.  Denies medication side effects.     DM- well controlled. Stable. F/U in 3-4 months.. Foot exam done today.  On ACE and statin.

## 2014-10-28 ENCOUNTER — Other Ambulatory Visit: Payer: Self-pay | Admitting: *Deleted

## 2014-10-28 DIAGNOSIS — E039 Hypothyroidism, unspecified: Secondary | ICD-10-CM

## 2014-10-28 LAB — COMPLETE METABOLIC PANEL WITH GFR
ALT: 17 U/L (ref 0–35)
AST: 15 U/L (ref 0–37)
Albumin: 4.1 g/dL (ref 3.5–5.2)
Alkaline Phosphatase: 48 U/L (ref 39–117)
BUN: 11 mg/dL (ref 6–23)
CALCIUM: 9.6 mg/dL (ref 8.4–10.5)
CHLORIDE: 106 meq/L (ref 96–112)
CO2: 23 mEq/L (ref 19–32)
Creat: 0.81 mg/dL (ref 0.50–1.10)
GFR, EST NON AFRICAN AMERICAN: 70 mL/min
GFR, Est African American: 80 mL/min
GLUCOSE: 142 mg/dL — AB (ref 70–99)
Potassium: 5.2 mEq/L (ref 3.5–5.3)
Sodium: 141 mEq/L (ref 135–145)
TOTAL PROTEIN: 7.1 g/dL (ref 6.0–8.3)
Total Bilirubin: 0.4 mg/dL (ref 0.2–1.2)

## 2014-10-28 LAB — LIPID PANEL
Cholesterol: 129 mg/dL (ref 0–200)
HDL: 42 mg/dL (ref >39)
LDL Cholesterol: 64 mg/dL (ref 0–99)
Total CHOL/HDL Ratio: 3.1 Ratio
Triglycerides: 116 mg/dL (ref <150)
VLDL: 23 mg/dL (ref 0–40)

## 2014-10-28 LAB — TSH: TSH: 0.251 u[IU]/mL — AB (ref 0.350–4.500)

## 2014-11-06 DIAGNOSIS — N390 Urinary tract infection, site not specified: Secondary | ICD-10-CM | POA: Diagnosis not present

## 2014-11-10 ENCOUNTER — Ambulatory Visit (INDEPENDENT_AMBULATORY_CARE_PROVIDER_SITE_OTHER): Payer: Medicare Other | Admitting: Family Medicine

## 2014-11-10 ENCOUNTER — Telehealth: Payer: Self-pay

## 2014-11-10 VITALS — BP 99/50 | HR 58 | Temp 97.9°F | Wt 149.0 lb

## 2014-11-10 DIAGNOSIS — I2510 Atherosclerotic heart disease of native coronary artery without angina pectoris: Secondary | ICD-10-CM | POA: Diagnosis not present

## 2014-11-10 DIAGNOSIS — N390 Urinary tract infection, site not specified: Secondary | ICD-10-CM

## 2014-11-10 DIAGNOSIS — R309 Painful micturition, unspecified: Secondary | ICD-10-CM

## 2014-11-10 DIAGNOSIS — R300 Dysuria: Secondary | ICD-10-CM | POA: Diagnosis not present

## 2014-11-10 DIAGNOSIS — B3749 Other urogenital candidiasis: Secondary | ICD-10-CM

## 2014-11-10 MED ORDER — CIPROFLOXACIN HCL 500 MG PO TABS: 500.0000 mg | ORAL_TABLET | Freq: Two times a day (BID) | ORAL | Status: DC

## 2014-11-10 NOTE — Patient Instructions (Signed)
Return to clinic if symptoms worsen or fail to improve.

## 2014-11-10 NOTE — Progress Notes (Signed)
   Subjective:    Patient ID: Gina [name], female    DOB: 06-16-1934, 80 y.o.   MRN: [id number]  HPI  Gina [name] complains of painful urination, frequent urination and urgency restarting today. She has the same symptoms staring Sunday. She was seen at [facility] in [place] the next day on Sunday,  and treated with Cefpodoxime 100 mg bid for 7 days. Her symptoms did resolve for a couple of days and return today. Denies fever, chills, sweats or back pain.   Review of Systems     Objective:   Physical Exam  Constitutional: She is oriented to person, place, and time. She appears well-developed and well-nourished.  HENT:  Head: Normocephalic and atraumatic.  Cardiovascular: Normal rate, regular rhythm and normal heart sounds.   Pulmonary/Chest: Effort normal and breath sounds normal.  Musculoskeletal:  No CVA tenderness  Neurological: She is alert and oriented to person, place, and time.  Skin: Skin is warm and dry.  Psychiatric: She has a normal mood and affect. Her behavior is normal.          Assessment & Plan:  UTI - likely resistant to current antibiotic treatment. We'll switch her to ciprofloxacin 5 mg twice a day for 5 days. She is to stop the Cefpodoxime. Make sure hydrating well and taking plenty of fluids. Call if she's not improving or if develops fevers chills or back pain.

## 2014-11-11 NOTE — Telephone Encounter (Signed)
Patient seen by Dr Madilyn Fireman for UTI symptoms.

## 2014-11-14 LAB — URINE CULTURE

## 2014-12-13 ENCOUNTER — Other Ambulatory Visit: Payer: Self-pay | Admitting: *Deleted

## 2014-12-13 MED ORDER — AMLODIPINE BESY-BENAZEPRIL HCL 10-40 MG PO CAPS: 1.0000 | ORAL_CAPSULE | Freq: Every day | ORAL | Status: DC

## 2014-12-15 ENCOUNTER — Telehealth: Payer: Self-pay | Admitting: *Deleted

## 2014-12-15 MED ORDER — AMLODIPINE BESY-BENAZEPRIL HCL 10-40 MG PO CAPS: 1.0000 | ORAL_CAPSULE | Freq: Every day | ORAL | Status: DC

## 2014-12-15 NOTE — Telephone Encounter (Signed)
Refill for bp med.Gina [name] [place]

## 2015-01-09 ENCOUNTER — Other Ambulatory Visit: Payer: Self-pay | Admitting: Family Medicine

## 2015-01-26 ENCOUNTER — Ambulatory Visit (INDEPENDENT_AMBULATORY_CARE_PROVIDER_SITE_OTHER): Payer: Medicare Other | Admitting: Family Medicine

## 2015-01-26 ENCOUNTER — Encounter: Payer: Self-pay | Admitting: Family Medicine

## 2015-01-26 VITALS — BP 113/61 | HR 67 | Wt 153.0 lb

## 2015-01-26 DIAGNOSIS — E119 Type 2 diabetes mellitus without complications: Secondary | ICD-10-CM | POA: Diagnosis not present

## 2015-01-26 DIAGNOSIS — I10 Essential (primary) hypertension: Secondary | ICD-10-CM | POA: Diagnosis not present

## 2015-01-26 DIAGNOSIS — E039 Hypothyroidism, unspecified: Secondary | ICD-10-CM

## 2015-01-26 LAB — POCT UA - MICROALBUMIN
Creatinine, POC: 50 mg/dL
MICROALBUMIN (UR) POC: 10 mg/L

## 2015-01-26 LAB — POCT GLYCOSYLATED HEMOGLOBIN (HGB A1C): Hemoglobin A1C: 6.4

## 2015-01-26 MED ORDER — AMLODIPINE BESY-BENAZEPRIL HCL 5-10 MG PO CAPS: 1.0000 | ORAL_CAPSULE | Freq: Every day | ORAL | Status: DC

## 2015-01-26 NOTE — Progress Notes (Signed)
   Subjective:    Patient ID: Gina [name], female    DOB: 1934/03/08, 81 y.o.   MRN: [id number]  HPI Diabetes - no hypoglycemic events. No wounds or sores that are not healing well. No increased thirst or urination. Checking glucose at home. Taking medications as prescribed without any side effects.  Hypertension- Pt denies chest pain, SOB, dizziness, or heart palpitations.  Taking meds as directed w/o problems.  Denies medication side effects.    Hypothyroid - No skin or hair changes.  No major weight, Though has gaine about 4 lbs.   Review of Systems     Objective:   Physical Exam  Constitutional: She is oriented to person, place, and time. She appears well-developed and well-nourished.  HENT:  Head: Normocephalic and atraumatic.  Cardiovascular: Normal rate, regular rhythm and normal heart sounds.   Pulmonary/Chest: Effort normal and breath sounds normal.  Neurological: She is alert and oriented to person, place, and time.  Skin: Skin is warm and dry.  Psychiatric: She has a normal mood and affect. Her behavior is normal.          Assessment & Plan:  DM- well controlled continue current regimen. Follow-up in 3 months. Due for urine microalbumin today.  Hypothyroidism-currently asymptomatic. Due to recheck iron levels. Lab slip given and she will go today.  Hypertension-blood pressure looks absolutely fantastic today. In fact her blood pressures have been low the last several time she's been here. I'm actually going to decrease her Lotrel today, to half of her current dose. Follow-up in 3 months.  Note-she is completely off of her Xanax to eat now. She weaned slowly and has not taken any in several weeks. We will remove this from her medication list.

## 2015-01-27 LAB — TSH: TSH: 0.771 u[IU]/mL (ref 0.350–4.500)

## 2015-01-27 NOTE — Progress Notes (Signed)
Quick Note:  All labs are normal. ______ 

## 2015-02-03 ENCOUNTER — Other Ambulatory Visit: Payer: Self-pay | Admitting: Family Medicine

## 2015-02-14 ENCOUNTER — Other Ambulatory Visit: Payer: Self-pay | Admitting: *Deleted

## 2015-02-14 DIAGNOSIS — E039 Hypothyroidism, unspecified: Secondary | ICD-10-CM

## 2015-02-14 MED ORDER — LEVOTHYROXINE SODIUM 100 MCG PO TABS: ORAL_TABLET | ORAL | Status: DC

## 2015-04-27 ENCOUNTER — Ambulatory Visit: Payer: Medicare Other | Admitting: Family Medicine

## 2015-05-01 ENCOUNTER — Other Ambulatory Visit: Payer: Self-pay | Admitting: Family Medicine

## 2015-05-08 ENCOUNTER — Ambulatory Visit (INDEPENDENT_AMBULATORY_CARE_PROVIDER_SITE_OTHER): Payer: Medicare Other | Admitting: Family Medicine

## 2015-05-08 ENCOUNTER — Encounter: Payer: Self-pay | Admitting: Family Medicine

## 2015-05-08 VITALS — BP 118/62 | HR 58 | Wt 155.0 lb

## 2015-05-08 DIAGNOSIS — Z720 Tobacco use: Secondary | ICD-10-CM | POA: Diagnosis not present

## 2015-05-08 DIAGNOSIS — E119 Type 2 diabetes mellitus without complications: Secondary | ICD-10-CM

## 2015-05-08 DIAGNOSIS — I10 Essential (primary) hypertension: Secondary | ICD-10-CM | POA: Diagnosis not present

## 2015-05-08 DIAGNOSIS — E039 Hypothyroidism, unspecified: Secondary | ICD-10-CM

## 2015-05-08 LAB — POCT GLYCOSYLATED HEMOGLOBIN (HGB A1C): Hemoglobin A1C: 6.3

## 2015-05-08 MED ORDER — METFORMIN HCL 500 MG PO TABS: 500.0000 mg | ORAL_TABLET | Freq: Two times a day (BID) | ORAL | Status: DC

## 2015-05-08 MED ORDER — NITROGLYCERIN 0.4 MG SL SUBL: 0.4000 mg | SUBLINGUAL_TABLET | SUBLINGUAL | Status: AC | PRN

## 2015-05-08 MED ORDER — LEVOTHYROXINE SODIUM 100 MCG PO TABS: ORAL_TABLET | ORAL | Status: DC

## 2015-05-08 NOTE — Progress Notes (Signed)
   Subjective:    Patient ID: Gina [name], female    DOB: 11/16/1934, 81 y.o.   MRN: [id number]  HPI Hypertension- Pt denies chest pain, SOB, dizziness, or heart palpitations.  Taking meds as directed w/o problems.  Denies medication side effects.    Diabetes - no hypoglycemic events. No wounds or sores that are not healing well. No increased thirst or urination. Checking glucose at home. Taking medications as prescribed without any side effects.  Hypothyroidism-no recent changes in skin or hair. No significant changes in energy level or weight.  Review of Systems     Objective:   Physical Exam  Constitutional: She is oriented to person, place, and time. She appears well-developed and well-nourished.  HENT:  Head: Normocephalic and atraumatic.  Cardiovascular: Normal rate, regular rhythm and normal heart sounds.   Pulmonary/Chest: Effort normal and breath sounds normal.  Neurological: She is alert and oriented to person, place, and time.  Skin: Skin is warm and dry.  Psychiatric: She has a normal mood and affect. Her behavior is normal.          Assessment & Plan:  HTN - well controlled. Continue current regimen.  F/U in 6 mo   DM- reminded her to get an eye exam.  A1C stable and looks great today.  F/U in 3-4 months.  Continue current regimen.  Tob abuse - she is not ready to quit. She says she does think about it some. Encouraged her to quit.  Hypothyroidism-due to recheck TSH. She'll go to the lab today.

## 2015-05-09 LAB — BASIC METABOLIC PANEL WITH GFR
BUN: 9 mg/dL (ref 6–23)
CHLORIDE: 108 meq/L (ref 96–112)
CO2: 25 mEq/L (ref 19–32)
Calcium: 9.7 mg/dL (ref 8.4–10.5)
Creat: 0.66 mg/dL (ref 0.50–1.10)
GFR, Est African American: >89 mL/min
GFR, Est Non African American: 85 mL/min
GLUCOSE: 68 mg/dL — AB (ref 70–99)
Potassium: 4.7 mEq/L (ref 3.5–5.3)
Sodium: 141 mEq/L (ref 135–145)

## 2015-05-09 LAB — TSH: TSH: 1.667 u[IU]/mL (ref 0.350–4.500)

## 2015-05-09 NOTE — Progress Notes (Signed)
Quick Note:  All labs are normal. ______ 

## 2015-07-31 ENCOUNTER — Other Ambulatory Visit: Payer: Self-pay | Admitting: Family Medicine

## 2015-09-05 ENCOUNTER — Other Ambulatory Visit: Payer: Self-pay | Admitting: Family Medicine

## 2015-09-07 ENCOUNTER — Encounter: Payer: Self-pay | Admitting: Family Medicine

## 2015-09-07 ENCOUNTER — Ambulatory Visit (INDEPENDENT_AMBULATORY_CARE_PROVIDER_SITE_OTHER): Payer: Medicare Other | Admitting: Family Medicine

## 2015-09-07 VITALS — BP 125/73 | HR 60 | Temp 98.0°F | Wt 153.0 lb

## 2015-09-07 DIAGNOSIS — Z720 Tobacco use: Secondary | ICD-10-CM | POA: Diagnosis not present

## 2015-09-07 DIAGNOSIS — Z23 Encounter for immunization: Secondary | ICD-10-CM | POA: Diagnosis not present

## 2015-09-07 DIAGNOSIS — E119 Type 2 diabetes mellitus without complications: Secondary | ICD-10-CM | POA: Diagnosis not present

## 2015-09-07 DIAGNOSIS — I10 Essential (primary) hypertension: Secondary | ICD-10-CM | POA: Diagnosis not present

## 2015-09-07 LAB — POCT GLYCOSYLATED HEMOGLOBIN (HGB A1C): Hemoglobin A1C: 6.5

## 2015-09-07 MED ORDER — GLIMEPIRIDE 4 MG PO TABS: 4.0000 mg | ORAL_TABLET | Freq: Every day | ORAL | Status: DC

## 2015-09-07 NOTE — Progress Notes (Signed)
   Subjective:    Patient ID: Gina [name], female    DOB: 02-07-1934, 81 y.o.   MRN: [id number]  HPI Diabetes - no hypoglycemic events. No wounds or sores that are not healing well. No increased thirst or urination. Checking glucose at home. Taking medications as prescribed without any side effects.  She is only taking her metformin once a day and the amaryl once a day. .  But only take in the evening.    Hypertension- Pt denies chest pain, SOB, dizziness, or heart palpitations.  Taking meds as directed w/o problems.  Denies medication side effects.    Tobacco abuse-she still vacillating between quitting and not quitting. She says it brings her to large amount pleasure but yet there as part of her nose that she really needs to quit.  Review of Systems     Objective:   Physical Exam  Constitutional: She is oriented to person, place, and time. She appears well-developed and well-nourished.  HENT:  Head: Normocephalic and atraumatic.  Cardiovascular: Normal rate, regular rhythm and normal heart sounds.   Pulmonary/Chest: Effort normal and breath sounds normal.  Neurological: She is alert and oriented to person, place, and time.  Skin: Skin is warm and dry.  Psychiatric: She has a normal mood and affect. Her behavior is normal.          Assessment & Plan:  DM- well controlled. A1C is 6.5.  Reminded due for eye exam.  Follow-up in 3 months. Will do CMP and lipid panel at that time.  HTN - well controlled. Continue current regimen.  Needs to stop smoking.    Tobacco abuse-encouraged smoking cessation.  Flu shot given today.    Also discussed need for bone density. She says she wants to think about it.

## 2015-10-04 ENCOUNTER — Ambulatory Visit (INDEPENDENT_AMBULATORY_CARE_PROVIDER_SITE_OTHER): Payer: Medicare Other | Admitting: Family Medicine

## 2015-10-04 ENCOUNTER — Telehealth: Payer: Self-pay | Admitting: *Deleted

## 2015-10-04 VITALS — BP 126/73 | Temp 97.5°F | Wt 153.0 lb

## 2015-10-04 DIAGNOSIS — R3915 Urgency of urination: Secondary | ICD-10-CM | POA: Diagnosis not present

## 2015-10-04 DIAGNOSIS — N3000 Acute cystitis without hematuria: Secondary | ICD-10-CM

## 2015-10-04 LAB — POCT URINALYSIS DIPSTICK
Bilirubin, UA: NEGATIVE
GLUCOSE UA: NEGATIVE
Ketones, UA: 15
NITRITE UA: NEGATIVE
PH UA: 5.5
Protein, UA: 30
Spec Grav, UA: 1.025
UROBILINOGEN UA: 0.2

## 2015-10-04 NOTE — Telephone Encounter (Signed)
Pt called and stated that she is having UTI sxs x 2 days she has been taking Azo. No fever,chills, no back or abd pain n/v only frequency, urgency. Given appt for today.Gina [name] [place]

## 2015-10-04 NOTE — Progress Notes (Signed)
   Subjective:    Patient ID: Gina [name], female    DOB: 1934/05/20, 81 y.o.   MRN: [id number]  HPI  Gina [name] is here to give a urine sample.  C/o urgency, frequency, and throbbing of the uterus.   Review of Systems     Objective:   Physical Exam        Assessment & Plan:  UTI - gram-positive for blood and leukocytes. We'll call in prescription for [name]. Urine culture pending.  Gina Lecher, MD

## 2015-10-05 MED ORDER — CIPROFLOXACIN HCL 500 MG PO TABS: 500.0000 mg | ORAL_TABLET | Freq: Two times a day (BID) | ORAL | Status: AC

## 2015-10-05 NOTE — Progress Notes (Signed)
Patient advised of recommendations.  

## 2015-10-06 LAB — URINE CULTURE: Colony Count: >=100000

## 2015-10-09 MED ORDER — NITROFURANTOIN MACROCRYSTAL 100 MG PO CAPS: 100.0000 mg | ORAL_CAPSULE | Freq: Two times a day (BID) | ORAL | Status: DC

## 2015-10-09 NOTE — Addendum Note (Signed)
Addended by: Beatrice Lecher D on: 10/09/2015 12:10 PM   Modules accepted: Orders

## 2015-10-23 DIAGNOSIS — Z961 Presence of intraocular lens: Secondary | ICD-10-CM | POA: Diagnosis not present

## 2015-10-23 DIAGNOSIS — H353132 Nonexudative age-related macular degeneration, bilateral, intermediate dry stage: Secondary | ICD-10-CM | POA: Diagnosis not present

## 2015-10-23 DIAGNOSIS — E119 Type 2 diabetes mellitus without complications: Secondary | ICD-10-CM | POA: Diagnosis not present

## 2015-10-23 DIAGNOSIS — H26491 Other secondary cataract, right eye: Secondary | ICD-10-CM | POA: Diagnosis not present

## 2015-10-23 DIAGNOSIS — H47233 Glaucomatous optic atrophy, bilateral: Secondary | ICD-10-CM | POA: Diagnosis not present

## 2015-10-23 DIAGNOSIS — H5203 Hypermetropia, bilateral: Secondary | ICD-10-CM | POA: Diagnosis not present

## 2015-10-23 LAB — HM DIABETES EYE EXAM

## 2015-11-03 ENCOUNTER — Other Ambulatory Visit: Payer: Self-pay | Admitting: Family Medicine

## 2015-11-11 ENCOUNTER — Other Ambulatory Visit: Payer: Self-pay | Admitting: Family Medicine

## 2015-12-07 ENCOUNTER — Ambulatory Visit (INDEPENDENT_AMBULATORY_CARE_PROVIDER_SITE_OTHER): Payer: Medicare Other | Admitting: Family Medicine

## 2015-12-07 ENCOUNTER — Encounter: Payer: Self-pay | Admitting: Family Medicine

## 2015-12-07 VITALS — BP 119/38 | HR 57 | Ht 64.6 in | Wt 152.0 lb

## 2015-12-07 DIAGNOSIS — E119 Type 2 diabetes mellitus without complications: Secondary | ICD-10-CM

## 2015-12-07 DIAGNOSIS — I10 Essential (primary) hypertension: Secondary | ICD-10-CM | POA: Diagnosis not present

## 2015-12-07 DIAGNOSIS — Z13220 Encounter for screening for lipoid disorders: Secondary | ICD-10-CM | POA: Diagnosis not present

## 2015-12-07 DIAGNOSIS — E039 Hypothyroidism, unspecified: Secondary | ICD-10-CM

## 2015-12-07 DIAGNOSIS — Z79899 Other long term (current) drug therapy: Secondary | ICD-10-CM

## 2015-12-07 DIAGNOSIS — Z720 Tobacco use: Secondary | ICD-10-CM

## 2015-12-07 LAB — COMPLETE METABOLIC PANEL WITH GFR
ALBUMIN: 3.7 g/dL (ref 3.6–5.1)
ALK PHOS: 40 U/L (ref 33–130)
ALT: 19 U/L (ref 6–29)
AST: 17 U/L (ref 10–35)
BILIRUBIN TOTAL: 0.4 mg/dL (ref 0.2–1.2)
BUN: 9 mg/dL (ref 7–25)
CO2: 18 mmol/L — ABNORMAL LOW (ref 20–31)
CREATININE: 0.72 mg/dL (ref 0.60–0.93)
Calcium: 9.4 mg/dL (ref 8.6–10.4)
Chloride: 108 mmol/L (ref 98–110)
GFR, EST NON AFRICAN AMERICAN: 80 mL/min (ref >=60)
GLUCOSE: 151 mg/dL — AB (ref 65–99)
Potassium: 4.6 mmol/L (ref 3.5–5.3)
Sodium: 139 mmol/L (ref 135–146)
TOTAL PROTEIN: 7 g/dL (ref 6.1–8.1)

## 2015-12-07 LAB — LIPID PANEL
Cholesterol: 137 mg/dL (ref 125–200)
HDL: 45 mg/dL — AB (ref >=46)
LDL Cholesterol: 64 mg/dL (ref <130)
TRIGLYCERIDES: 142 mg/dL (ref <150)
Total CHOL/HDL Ratio: 3 Ratio (ref <=5.0)
VLDL: 28 mg/dL (ref <30)

## 2015-12-07 LAB — POCT GLYCOSYLATED HEMOGLOBIN (HGB A1C): HEMOGLOBIN A1C: 6.5

## 2015-12-07 MED ORDER — METFORMIN HCL 500 MG PO TABS: 500.0000 mg | ORAL_TABLET | Freq: Two times a day (BID) | ORAL | Status: DC

## 2015-12-07 NOTE — Progress Notes (Signed)
   Subjective:    Patient ID: Gina [name], female    DOB: 20-Jun-1934, 81 y.o.   MRN: [id number]  HPI Diabetes - no hypoglycemic events. No wounds or sores that are not healing well. No increased thirst or urination. Checking glucose at home. Taking medications as prescribed without any side effects.  Eye exam [facility] about a month ago.   Hypertension- Pt denies chest pain, SOB, dizziness, or heart palpitations.  Taking meds as directed w/o problems.  Denies medication side effects.    Hypothyroid - no skin or hair changes. No recent weight changes.  Some exercise.    Review of Systems     Objective:   Physical Exam  Constitutional: She is oriented to person, place, and time. She appears well-developed and well-nourished.  HENT:  Head: Normocephalic and atraumatic.  Neck: Neck supple. No thyromegaly present.  Cardiovascular: Normal rate, regular rhythm and normal heart sounds.   Pulmonary/Chest: Effort normal and breath sounds normal.  Lymphadenopathy:    She has no cervical adenopathy.  Neurological: She is alert and oriented to person, place, and time.  Skin: Skin is warm and dry.  Psychiatric: She has a normal mood and affect. Her behavior is normal.          Assessment & Plan:  DM- well controlled. A1C of 6.5.  Will call to get eye exam from [facility] eye surgeon.  Continue current regimen. Foot exam performed today.    HTN - well controlled. Continue current regimen.  Hypothyroidism-last TSH level looks great and she is asymptomatic.  She declines a shingles vaccine today. Other vaccinations are up-to-date.  Tobacco abuse-encourage cessation. She says she sheneedstoquitbutisnotquitereadyto.

## 2015-12-08 ENCOUNTER — Telehealth: Payer: Self-pay

## 2015-12-08 NOTE — Telephone Encounter (Signed)
-----   Message from Hali Marry, MD sent at 12/08/2015  9:09 AM EST ----- Call pt: labs look OK.

## 2015-12-08 NOTE — Telephone Encounter (Signed)
Patient aware of normal lab results

## 2016-01-19 ENCOUNTER — Other Ambulatory Visit: Payer: Self-pay | Admitting: Family Medicine

## 2016-03-08 ENCOUNTER — Encounter: Payer: Self-pay | Admitting: *Deleted

## 2016-03-11 ENCOUNTER — Ambulatory Visit: Payer: Private Health Insurance - Indemnity | Admitting: Family Medicine

## 2016-03-15 ENCOUNTER — Ambulatory Visit (INDEPENDENT_AMBULATORY_CARE_PROVIDER_SITE_OTHER): Payer: Medicare Other

## 2016-03-15 ENCOUNTER — Ambulatory Visit (INDEPENDENT_AMBULATORY_CARE_PROVIDER_SITE_OTHER): Payer: Medicare Other | Admitting: Family Medicine

## 2016-03-15 ENCOUNTER — Telehealth: Payer: Self-pay | Admitting: Family Medicine

## 2016-03-15 ENCOUNTER — Encounter: Payer: Self-pay | Admitting: Family Medicine

## 2016-03-15 DIAGNOSIS — I10 Essential (primary) hypertension: Secondary | ICD-10-CM | POA: Diagnosis not present

## 2016-03-15 DIAGNOSIS — F17200 Nicotine dependence, unspecified, uncomplicated: Secondary | ICD-10-CM

## 2016-03-15 DIAGNOSIS — Z720 Tobacco use: Secondary | ICD-10-CM

## 2016-03-15 DIAGNOSIS — E119 Type 2 diabetes mellitus without complications: Secondary | ICD-10-CM

## 2016-03-15 DIAGNOSIS — H3530 Unspecified macular degeneration: Secondary | ICD-10-CM

## 2016-03-15 DIAGNOSIS — R05 Cough: Secondary | ICD-10-CM

## 2016-03-15 LAB — POCT GLYCOSYLATED HEMOGLOBIN (HGB A1C): HEMOGLOBIN A1C: 6.6

## 2016-03-15 NOTE — Patient Instructions (Signed)
Smoking Cessation, Tips for Success If you are ready to quit smoking, congratulations! You have chosen to help yourself be healthier. Cigarettes bring nicotine, tar, carbon monoxide, and other irritants into your body. Your lungs, heart, and blood vessels will be able to work better without these poisons. There are many different ways to quit smoking. Nicotine gum, nicotine patches, a nicotine inhaler, or nicotine nasal spray can help with physical craving. Hypnosis, support groups, and medicines help break the habit of smoking. WHAT THINGS CAN I DO TO MAKE QUITTING EASIER?  Here are some tips to help you quit for good:  Pick a date when you will quit smoking completely. Tell all of your friends and family about your plan to quit on that date.  Do not try to slowly cut down on the number of cigarettes you are smoking. Pick a quit date and quit smoking completely starting on that day.  Throw away all cigarettes.   Clean and remove all ashtrays from your home, work, and car.  On a card, write down your reasons for quitting. Carry the card with you and read it when you get the urge to smoke.  Cleanse your body of nicotine. Drink enough water and fluids to keep your urine clear or pale yellow. Do this after quitting to flush the nicotine from your body.  Learn to predict your moods. Do not let a bad situation be your excuse to have a cigarette. Some situations in your life might tempt you into wanting a cigarette.  Never have "just one" cigarette. It leads to wanting another and another. Remind yourself of your decision to quit.  Change habits associated with smoking. If you smoked while driving or when feeling stressed, try other activities to replace smoking. Stand up when drinking your coffee. Brush your teeth after eating. Sit in a different chair when you read the paper. Avoid alcohol while trying to quit, and try to drink fewer caffeinated beverages. Alcohol and caffeine may urge you to  smoke.  Avoid foods and drinks that can trigger a desire to smoke, such as sugary or spicy foods and alcohol.  Ask people who smoke not to smoke around you.  Have something planned to do right after eating or having a cup of coffee. For example, plan to take a walk or exercise.  Try a relaxation exercise to calm you down and decrease your stress. Remember, you may be tense and nervous for the first 2 weeks after you quit, but this will pass.  Find new activities to keep your hands busy. Play with a pen, coin, or rubber band. Doodle or draw things on paper.  Brush your teeth right after eating. This will help cut down on the craving for the taste of tobacco after meals. You can also try mouthwash.   Use oral substitutes in place of cigarettes. Try using lemon drops, carrots, cinnamon sticks, or chewing gum. Keep them handy so they are available when you have the urge to smoke.  When you have the urge to smoke, try deep breathing.  Designate your home as a nonsmoking area.  If you are a heavy smoker, ask your health care provider about a prescription for nicotine chewing gum. It can ease your withdrawal from nicotine.  Reward yourself. Set aside the cigarette money you save and buy yourself something nice.  Look for support from others. Join a support group or smoking cessation program. Ask someone at home or at work to help you with your plan   to quit smoking.  Always ask yourself, "Do I need this cigarette or is this just a reflex?" Tell yourself, "Today, I choose not to smoke," or "I do not want to smoke." You are reminding yourself of your decision to quit.  Do not replace cigarette smoking with electronic cigarettes (commonly called e-cigarettes). The safety of e-cigarettes is unknown, and some may contain harmful chemicals.  If you relapse, do not give up! Plan ahead and think about what you will do the next time you get the urge to smoke. HOW WILL I FEEL WHEN I QUIT SMOKING? You  may have symptoms of withdrawal because your body is used to nicotine (the addictive substance in cigarettes). You may crave cigarettes, be irritable, feel very hungry, cough often, get headaches, or have difficulty concentrating. The withdrawal symptoms are only temporary. They are strongest when you first quit but will go away within 10-14 days. When withdrawal symptoms occur, stay in control. Think about your reasons for quitting. Remind yourself that these are signs that your body is healing and getting used to being without cigarettes. Remember that withdrawal symptoms are easier to treat than the major diseases that smoking can cause.  Even after the withdrawal is over, expect periodic urges to smoke. However, these cravings are generally short lived and will go away whether you smoke or not. Do not smoke! WHAT RESOURCES ARE AVAILABLE TO HELP ME QUIT SMOKING? Your health care provider can direct you to community resources or hospitals for support, which may include:  Group support.  Education.  Hypnosis.  Therapy.   This information is not intended to replace advice given to you by your health care provider. Make sure you discuss any questions you have with your health care provider.   Document Released: 09/13/2004 Document Revised: 01/06/2015 Document Reviewed: 06/03/2013 Elsevier Interactive Patient Education 2016 [employer]  

## 2016-03-15 NOTE — Progress Notes (Signed)
   Subjective:    Patient ID: Gina [name], female    DOB: 10/09/1934, 82 y.o.   MRN: [id number]  HPI Diabetes - no hypoglycemic events. No wounds or sores that are not healing well. No increased thirst or urination. Checking glucose at home. Taking medications as prescribed without any side effects.  Hypertension- Pt denies chest pain, SOB, dizziness, or heart palpitations.  Taking meds as directed w/o problems.  Denies medication side effects.    She complains about a mild intermittent cough that started the last couple of months is occasionally productive. She says it's really not bothersome or painful. She denies any increased shortness of breath with activities.   Review of Systems     Objective:   Physical Exam  Constitutional: She is oriented to person, place, and time. She appears well-developed and well-nourished.  HENT:  Head: Normocephalic and atraumatic.  Cardiovascular: Normal rate, regular rhythm and normal heart sounds.   Pulmonary/Chest: Effort normal and breath sounds normal.  Neurological: She is alert and oriented to person, place, and time.  Skin: Skin is warm and dry.  Psychiatric: She has a normal mood and affect. Her behavior is normal.          Assessment & Plan:  DM- Well controlled. Doing very well. Hemoglobin A1c of 6.6 today. Continue current regimen. Follow-up in 3 months. Labs are up-to-date. Monofilament foot exam performed today. Call for copy of recent eye exam done in October.  HTN - Well controlled. Continue current regimen.  Macular degeneration-we'll get messed up-to-date eye exam report from [place].  Tobacco abuse- encourage cessation. Additional handout and information given. Recommend chest x-ray today because she does complain of a slight intermittent cough is occasionally productive.

## 2016-03-18 NOTE — Telephone Encounter (Signed)
error 

## 2016-04-11 ENCOUNTER — Encounter: Payer: Self-pay | Admitting: Family Medicine

## 2016-04-24 ENCOUNTER — Other Ambulatory Visit: Payer: Self-pay | Admitting: Family Medicine

## 2016-05-03 ENCOUNTER — Other Ambulatory Visit: Payer: Self-pay | Admitting: Family Medicine

## 2016-06-17 ENCOUNTER — Ambulatory Visit: Payer: Medicare Other | Admitting: Family Medicine

## 2016-06-24 ENCOUNTER — Ambulatory Visit (INDEPENDENT_AMBULATORY_CARE_PROVIDER_SITE_OTHER): Payer: Medicare Other

## 2016-06-24 ENCOUNTER — Encounter: Payer: Self-pay | Admitting: Family Medicine

## 2016-06-24 ENCOUNTER — Ambulatory Visit (INDEPENDENT_AMBULATORY_CARE_PROVIDER_SITE_OTHER): Payer: Medicare Other | Admitting: Family Medicine

## 2016-06-24 VITALS — BP 124/61 | HR 54 | Wt 152.0 lb

## 2016-06-24 DIAGNOSIS — E119 Type 2 diabetes mellitus without complications: Secondary | ICD-10-CM | POA: Diagnosis not present

## 2016-06-24 DIAGNOSIS — Z780 Asymptomatic menopausal state: Secondary | ICD-10-CM

## 2016-06-24 DIAGNOSIS — E039 Hypothyroidism, unspecified: Secondary | ICD-10-CM | POA: Diagnosis not present

## 2016-06-24 DIAGNOSIS — I10 Essential (primary) hypertension: Secondary | ICD-10-CM

## 2016-06-24 DIAGNOSIS — M810 Age-related osteoporosis without current pathological fracture: Secondary | ICD-10-CM | POA: Diagnosis not present

## 2016-06-24 LAB — POCT GLYCOSYLATED HEMOGLOBIN (HGB A1C): HEMOGLOBIN A1C: 6.4

## 2016-06-24 MED ORDER — METFORMIN HCL 500 MG PO TABS: 500.0000 mg | ORAL_TABLET | Freq: Two times a day (BID) | ORAL | Status: DC

## 2016-06-24 NOTE — Progress Notes (Signed)
Subjective:    CC: DM  HPI: Diabetes - no hypoglycemic events. No wounds or sores that are not healing well. No increased thirst or urination. Checking glucose at home. Taking medications as prescribed without any side effects.ome blood sugars range from the 90s into the 120s.  Hypertension- Pt denies chest pain, SOB, dizziness, or heart palpitations.  Taking meds as directed w/o problems.  Denies medication side effects.    Hypothyroid - No recent skin or hair changes.  She feels like she's doing really well on her current regimen. She's been taking her medication regularly.  Past medical history, Surgical history, Family history not pertinant except as noted below, Social history, Allergies, and medications have been entered into the medical record, reviewed, and corrections made.   Review of Systems: No fevers, chills, night sweats, weight loss, chest pain, or shortness of breath.   Objective:    General: Well Developed, well nourished, and in no acute distress.  Neuro: Alert and oriented x3, extra-ocular muscles intact, sensation grossly intact.  HEENT: Normocephalic, atraumatic  Skin: Warm and dry, no rashes. Cardiac: Regular rate and rhythm, no murmurs rubs or gallops, no lower extremity edema.  Respiratory: Clear to auscultation bilaterally. Not using accessory muscles, speaking in full sentences.   Impression and Recommendations:   DM- well controlled. Hemoglobin A1c down to 6.4 today which is fantastic. Continue current regimen. Follow-up in 3-4 months.due for BMP.  HTN- well controlled.  continue new current regimen.  Hypothyroidism-due to recheck TSH. Will adjust medication if needed. Otherwise continue current dosing regimen.

## 2016-06-25 LAB — BASIC METABOLIC PANEL
BUN: 13 mg/dL (ref 7–25)
CALCIUM: 9.5 mg/dL (ref 8.6–10.4)
CO2: 20 mmol/L (ref 20–31)
CREATININE: 0.89 mg/dL (ref 0.60–0.93)
Chloride: 107 mmol/L (ref 98–110)
Glucose, Bld: 126 mg/dL — ABNORMAL HIGH (ref 65–99)
Potassium: 4.4 mmol/L (ref 3.5–5.3)
SODIUM: 141 mmol/L (ref 135–146)

## 2016-06-25 LAB — TSH: TSH: 0.4 m[IU]/L

## 2016-06-25 NOTE — Progress Notes (Signed)
Quick Note:  All labs are normal. ______ 

## 2016-07-09 ENCOUNTER — Other Ambulatory Visit: Payer: Self-pay | Admitting: Family Medicine

## 2016-08-03 ENCOUNTER — Other Ambulatory Visit: Payer: Self-pay | Admitting: Family Medicine

## 2016-10-03 ENCOUNTER — Encounter: Payer: Self-pay | Admitting: Family Medicine

## 2016-10-03 ENCOUNTER — Ambulatory Visit (INDEPENDENT_AMBULATORY_CARE_PROVIDER_SITE_OTHER): Payer: Medicare Other | Admitting: Family Medicine

## 2016-10-03 VITALS — BP 110/69 | HR 62 | Ht 64.5 in | Wt 152.0 lb

## 2016-10-03 DIAGNOSIS — I10 Essential (primary) hypertension: Secondary | ICD-10-CM | POA: Diagnosis not present

## 2016-10-03 DIAGNOSIS — E119 Type 2 diabetes mellitus without complications: Secondary | ICD-10-CM | POA: Diagnosis not present

## 2016-10-03 DIAGNOSIS — Z720 Tobacco use: Secondary | ICD-10-CM | POA: Diagnosis not present

## 2016-10-03 LAB — POCT GLYCOSYLATED HEMOGLOBIN (HGB A1C): HEMOGLOBIN A1C: 6.4

## 2016-10-03 NOTE — Progress Notes (Signed)
Subjective:    CC: DM/HTN HPI: Diabetes - no hypoglycemic events. No wounds or sores that are not healing well. No increased thirst or urination. Checking glucose at home. Taking medications as prescribed without any side effects.  Hypertension- Pt denies chest pain, SOB, dizziness, or heart palpitations.  Taking meds as directed w/o problems.  Denies medication side effects.    Tobacco abuse-she's down to 5 cigarettes a day but is still really struggling. She does not want to take medication for this.  Past medical history, Surgical history, Family history not pertinant except as noted below, Social history, Allergies, and medications have been entered into the medical record, reviewed, and corrections made.   Review of Systems: No fevers, chills, night sweats, weight loss, chest pain, or shortness of breath.   Objective:    General: Well Developed, well nourished, and in no acute distress.  Neuro: Alert and oriented x3, extra-ocular muscles intact, sensation grossly intact.  HEENT: Normocephalic, atraumatic  Skin: Warm and dry, no rashes. Cardiac: Regular rate and rhythm, no murmurs rubs or gallops, no lower extremity edema.  Respiratory: Clear to auscultation bilaterally. Not using accessory muscles, speaking in full sentences.   Impression and Recommendations:    DM- Well controlled. Continue current regimen. Follow up in  3-4 months.   HTN - Well controlled. Continue current regimen. Follow up in  3-4 months.    tob abuse - Encourage her to continue to work on it every day and try to decrease the amount that she is smoking.

## 2016-10-03 NOTE — Patient Instructions (Signed)
Make sure to take your vitamin 4 hours away from your thyroid medication.   Can schedule your flu shot any time.

## 2016-10-20 ENCOUNTER — Other Ambulatory Visit: Payer: Self-pay | Admitting: Family Medicine

## 2016-10-31 ENCOUNTER — Other Ambulatory Visit: Payer: Self-pay | Admitting: Family Medicine

## 2016-11-30 ENCOUNTER — Other Ambulatory Visit: Payer: Self-pay | Admitting: Family Medicine

## 2016-12-29 ENCOUNTER — Other Ambulatory Visit: Payer: Self-pay | Admitting: Family Medicine

## 2017-01-15 ENCOUNTER — Ambulatory Visit: Payer: Medicare Other | Admitting: Family Medicine

## 2017-01-22 ENCOUNTER — Encounter: Payer: Self-pay | Admitting: Family Medicine

## 2017-01-22 ENCOUNTER — Telehealth: Payer: Self-pay | Admitting: Family Medicine

## 2017-01-22 ENCOUNTER — Ambulatory Visit: Payer: Medicare Other | Admitting: Family Medicine

## 2017-01-22 ENCOUNTER — Ambulatory Visit (INDEPENDENT_AMBULATORY_CARE_PROVIDER_SITE_OTHER): Payer: Medicare Other | Admitting: Family Medicine

## 2017-01-22 VITALS — BP 129/53 | HR 61 | Ht 65.0 in | Wt 153.0 lb

## 2017-01-22 VITALS — BP 110/80 | HR 68 | Temp 97.6°F | Resp 16 | Ht 65.0 in | Wt 153.0 lb

## 2017-01-22 DIAGNOSIS — Z0000 Encounter for general adult medical examination without abnormal findings: Secondary | ICD-10-CM

## 2017-01-22 DIAGNOSIS — I10 Essential (primary) hypertension: Secondary | ICD-10-CM | POA: Diagnosis not present

## 2017-01-22 DIAGNOSIS — E039 Hypothyroidism, unspecified: Secondary | ICD-10-CM | POA: Diagnosis not present

## 2017-01-22 DIAGNOSIS — E119 Type 2 diabetes mellitus without complications: Secondary | ICD-10-CM

## 2017-01-22 DIAGNOSIS — Z6825 Body mass index (BMI) 25.0-25.9, adult: Secondary | ICD-10-CM

## 2017-01-22 DIAGNOSIS — Z87891 Personal history of nicotine dependence: Secondary | ICD-10-CM

## 2017-01-22 DIAGNOSIS — Z9181 History of falling: Secondary | ICD-10-CM

## 2017-01-22 LAB — LIPID PANEL
CHOL/HDL RATIO: 2.7 ratio (ref <5.0)
Cholesterol: 127 mg/dL (ref <200)
HDL: 47 mg/dL — AB (ref >50)
LDL CALC: 53 mg/dL (ref <100)
TRIGLYCERIDES: 135 mg/dL (ref <150)
VLDL: 27 mg/dL (ref <30)

## 2017-01-22 LAB — COMPLETE METABOLIC PANEL WITH GFR
ALT: 18 U/L (ref 6–29)
AST: 15 U/L (ref 10–35)
Albumin: 4.1 g/dL (ref 3.6–5.1)
Alkaline Phosphatase: 49 U/L (ref 33–130)
BUN: 11 mg/dL (ref 7–25)
CHLORIDE: 105 mmol/L (ref 98–110)
CO2: 22 mmol/L (ref 20–31)
Calcium: 9.8 mg/dL (ref 8.6–10.4)
Creat: 0.77 mg/dL (ref 0.60–0.88)
GFR, EST AFRICAN AMERICAN: 84 mL/min (ref >=60)
GFR, Est Non African American: 73 mL/min (ref >=60)
Glucose, Bld: 114 mg/dL — ABNORMAL HIGH (ref 65–99)
POTASSIUM: 4.1 mmol/L (ref 3.5–5.3)
Sodium: 139 mmol/L (ref 135–146)
Total Bilirubin: 0.5 mg/dL (ref 0.2–1.2)
Total Protein: 7.4 g/dL (ref 6.1–8.1)

## 2017-01-22 LAB — TSH: TSH: 0.7 mIU/L

## 2017-01-22 LAB — POCT GLYCOSYLATED HEMOGLOBIN (HGB A1C): HEMOGLOBIN A1C: 6.7

## 2017-01-22 NOTE — Progress Notes (Signed)
Subjective:   Gina [name] is a 83 y.o. female who presents for Medicare Annual (Subsequent) preventive examination.  Review of Systems:   Cardiac Risk Factors include: advanced age (>13men, >64 women);diabetes mellitus;dyslipidemia;hypertension;obesity (BMI >30kg/m2);smoking/ tobacco exposure     Objective:     Vitals: BP 110/80   Pulse 68   Temp 97.6 F (36.4 C) (Oral)   Resp 16   Ht 5\' 5"  (1.651 m)   Wt 153 lb (69.4 kg)   BMI 25.46 kg/m   Body mass index is 25.46 kg/m.   Tobacco History  Smoking Status  . Current Every Day Smoker  . Packs/day: 0.25  . Years: 60.00  . Types: Cigarettes  Smokeless Tobacco  . Never Used    Comment: 5 cigarettes/day      Ready to quit: No Counseling given: No   Past Medical History:  Diagnosis Date  . CAD (coronary artery disease)   . Colon cancer [facility])    Chemotherapy  . High cholesterol   . HTN (hypertension)   . Hypothyroidism   . T2DM (type 2 diabetes mellitus) ([place])    Past Surgical History:  Procedure Laterality Date  . APPENDECTOMY    . CABG     1998.   Marland Kitchen CATARACT EXTRACTION, BILATERAL    . COLECTOMY  2003   for cancer. partial  . CORONARY STENT PLACEMENT  2008  . LAPAROSCOPIC CHOLECYSTECTOMY     Family History  Problem Relation Age of Onset  . Cancer Daughter   . Diabetes Mother   . Coronary artery disease Father     Died of MI at age 28  . Cancer Brother   . Alzheimer's disease Maternal Aunt   . Heart disease Paternal Aunt   . Heart disease Paternal Uncle   . Kidney disease Maternal Grandfather   . Heart disease Paternal Grandmother   . Stroke Paternal Grandfather    History  Sexual Activity  . Sexual activity: Not Currently    Outpatient Encounter Prescriptions as of 01/22/2017  Medication Sig  . AMBULATORY NON FORMULARY MEDICATION Medication Name: Glucose meter strips ( [employer] brand). Test once a day . Dx. 250.0  . AMBULATORY NON FORMULARY MEDICATION Medication Name: Reli-On  Ultia blood glucose test strip. Test 1 x a day.  Dx. 250.00  . amLODipine-benazepril (LOTREL) 5-10 MG capsule Take 1 capsule by mouth daily. NEED FOLLOW UP VISIT FOR MORE REFILLS  . aspirin 325 MG tablet Take 325 mg by mouth daily.    Marland Kitchen atorvastatin (LIPITOR) 40 MG tablet TAKE 1 TABLET BY MOUTH ONCE DAILY  . glimepiride (AMARYL) 4 MG tablet TAKE 1 TABLET (4 MG TOTAL) BY MOUTH DAILY WITH BREAKFAST.  Marland Kitchen levothyroxine (SYNTHROID, LEVOTHROID) 100 MCG tablet TAKE 1 TABLET BY MOUTH DAILY  . metFORMIN (GLUCOPHAGE) 500 MG tablet Take 1 tablet (500 mg total) by mouth 2 (two) times daily with a meal.  . nitroGLYCERIN (NITROSTAT) 0.4 MG SL tablet Place 1 tablet (0.4 mg total) under the tongue every 5 (five) minutes as needed.  . Multiple Vitamins-Minerals (PRESERVISION AREDS 2) CAPS Take 1 capsule by mouth daily.  . [DISCONTINUED] Cranberry 500 MG CAPS Take 2 capsules by mouth daily.   No facility-administered encounter medications on file as of 01/22/2017.     Activities of Daily Living In your present state of health, do you have any difficulty performing the following activities: 01/22/2017  Hearing? N  Vision? N  Difficulty concentrating or making decisions? N  Walking or climbing  stairs? N  Dressing or bathing? N  Doing errands, shopping? N  Preparing Food and eating ? N  Using the Toilet? N  In the past six months, have you accidently leaked urine? N  Do you have problems with loss of bowel control? N  Managing your Medications? N  Managing your Finances? N  Housekeeping or managing your Housekeeping? N  Some recent data might be hidden    Patient Care Team: Hali Marry, MD as PCP - General (Family Medicine) Leonia Corona, MD as Referring Physician (Ophthalmology)    Assessment:     Exercise Activities and Dietary recommendations Current Exercise Habits: Home exercise routine, Type of exercise: treadmill (Stationary Bike), Time (Minutes): 40, Frequency (Times/Week): 3,  Weekly Exercise (Minutes/Week): 120, Intensity: Mild  Goals      Patient Stated   . Exercise 3x per week (30 min per time) (pt-stated)          She would like to be committed to being more consistent.      Fall Risk Fall Risk  01/22/2017 03/15/2016 09/07/2015 07/27/2014 10/14/2013  Falls in the past year? No No No No Yes  Number falls in past yr: - - - - 2 or more  Injury with Fall? - - - - No  Risk for fall due to : - - - - Other (Comment)   Depression Screen PHQ 2/9 Scores 01/22/2017 03/15/2016 07/27/2014 10/14/2013  PHQ - 2 Score 0 0 2 0  PHQ- 9 Score - - 4 -     Cognitive Function     6CIT Screen 01/22/2017  What Year? 0 points  What month? 0 points  What time? 0 points  Count back from 20 0 points  Months in reverse 0 points  Repeat phrase 0 points  Total Score 0    Immunization History  Administered Date(s) Administered  . Influenza Split 12/05/2011  . Influenza,inj,Quad PF,36+ Mos 10/14/2013, 10/27/2014, 09/07/2015  . Pneumococcal Conjugate-13 07/27/2014  . Pneumococcal Polysaccharide-23 08/23/2011  . Tdap 12/05/2011   Screening Tests Health Maintenance  Topic Date Due  . ZOSTAVAX  10/01/1996  . COLONOSCOPY  09/23/2012  . OPHTHALMOLOGY EXAM  01/29/2019 (Originally 10/22/2016)  . INFLUENZA VACCINE  03/29/2024 (Originally 07/30/2016)  . FOOT EXAM  03/15/2017  . HEMOGLOBIN A1C  04/03/2017  . TETANUS/TDAP  12/04/2021  . DEXA SCAN  Completed  . PNA vac Low Risk Adult  Completed      Plan:   During the course of the visit the patient was educated and counseled about the following appropriate screening and preventive services:   Vaccines to include Pneumoccal, Influenza, , Td, Zostavax,  Cardiovascular Disease  Colorectal cancer screening/aged out  Bone density screening  Diabetes screening  Glaucoma screening  Mammography/PAP/aged out  Nutrition counseling   Patient Instructions (the written plan) was given to the patient.   Nestor Lewandowsky, RN  01/22/2017

## 2017-01-22 NOTE — Patient Instructions (Signed)
Fat and Cholesterol Restricted Diet High levels of fat and cholesterol in your blood may lead to various health problems, such as diseases of the heart, blood vessels, gallbladder, liver, and pancreas. Fats are concentrated sources of energy that come in various forms. Certain types of fat, including saturated fat, may be harmful in excess. Cholesterol is a substance needed by your body in small amounts. Your body makes all the cholesterol it needs. Excess cholesterol comes from the food you eat. When you have high levels of cholesterol and saturated fat in your blood, health problems can develop because the excess fat and cholesterol will gather along the walls of your blood vessels, causing them to narrow. Choosing the right foods will help you control your intake of fat and cholesterol. This will help keep the levels of these substances in your blood within normal limits and reduce your risk of disease. What is my plan? Your health care provider recommends that you:  Limit your fat intake to ______% or less of your total calories per day.  Limit the amount of cholesterol in your diet to less than _________mg per day.  Eat 20-30 grams of fiber each day. What types of fat should I choose?  Choose healthy fats more often. Choose monounsaturated and polyunsaturated fats, such as olive and canola oil, flaxseeds, walnuts, almonds, and seeds.  Eat more omega-3 fats. Good choices include salmon, mackerel, sardines, tuna, flaxseed oil, and ground flaxseeds. Aim to eat fish at least two times a week.  Limit saturated fats. Saturated fats are primarily found in animal products, such as meats, butter, and cream. Plant sources of saturated fats include palm oil, palm kernel oil, and coconut oil.  Avoid foods with partially hydrogenated oils in them. These contain trans fats. Examples of foods that contain trans fats are stick margarine, some tub margarines, cookies, crackers, and other baked goods. What  general guidelines do I need to follow? These guidelines for healthy eating will help you control your intake of fat and cholesterol:  Check food labels carefully to identify foods with trans fats or high amounts of saturated fat.  Fill one half of your plate with vegetables and green salads.  Fill one fourth of your plate with whole grains. Look for the word "whole" as the first word in the ingredient list.  Fill one fourth of your plate with lean protein foods.  Limit fruit to two servings a day. Choose fruit instead of juice.  Eat more foods that contain fiber, such as apples, broccoli, carrots, beans, peas, and barley.  Eat more home-cooked food and less restaurant, buffet, and fast food.  Limit or avoid alcohol.  Limit foods high in starch and sugar.  Limit fried foods.  Cook foods using methods other than frying. Baking, boiling, grilling, and broiling are all great options.  Lose weight if you are overweight. Losing just 5-10% of your initial body weight can help your overall health and prevent diseases such as diabetes and heart disease. What foods can I eat? Grains  Whole grains, such as whole wheat or whole grain breads, crackers, cereals, and pasta. Unsweetened oatmeal, bulgur, barley, quinoa, or brown rice. Corn or whole wheat flour tortillas. Vegetables  Fresh or frozen vegetables (raw, steamed, roasted, or grilled). Green salads. Fruits  All fresh, canned (in natural juice), or frozen fruits. Meats and other protein foods  Ground beef (85% or leaner), grass-fed beef, or beef trimmed of fat. Skinless chicken or [place]. Ground chicken or [place]. Pork trimmed  of fat. All fish and seafood. Eggs. Dried beans, peas, or lentils. Unsalted nuts or seeds. Unsalted canned or dry beans. Dairy  Low-fat dairy products, such as skim or 1% milk, 2% or reduced-fat cheeses, low-fat ricotta or cottage cheese, or plain low-fat yo Fats and oils  Tub margarines without trans fats.  Light or reduced-fat mayonnaise and salad dressings. Avocado. Olive, canola, sesame, or safflower oils. Natural peanut or almond butter (choose ones without added sugar and oil). The items listed above may not be a complete list of recommended foods or beverages. Contact your dietitian for more options.  Foods to avoid Grains  White bread. White pasta. White rice. Cornbread. Bagels, pastries, and croissants. Crackers that contain trans fat. Vegetables  White potatoes. Corn. Creamed or fried vegetables. Vegetables in a cheese sauce. Fruits  Dried fruits. Canned fruit in light or heavy syrup. Fruit juice. Meats and other protein foods  Fatty cuts of meat. Ribs, chicken wings, bacon, sausage, bologna, salami, chitterlings, fatback, hot dogs, bratwurst, and packaged luncheon meats. Liver and organ meats. Dairy  Whole or 2% milk, cream, half-and-half, and cream cheese. Whole milk cheeses. Whole-fat or sweetened yogurt. Full-fat cheeses. Nondairy creamers and whipped toppings. Processed cheese, cheese spreads, or cheese curds. Beverages  Alcohol. Sweetened drinks (such as sodas, lemonade, and fruit drinks or punches). Fats and oils  Butter, stick margarine, lard, shortening, ghee, or bacon fat. Coconut, palm kernel, or palm oils. Sweets and desserts  Corn syrup, sugars, honey, and molasses. Candy. Jam and jelly. Syrup. Sweetened cereals. Cookies, pies, cakes, donuts, muffins, and ice cream. The items listed above may not be a complete list of foods and beverages to avoid. Contact your dietitian for more information.  This information is not intended to replace advice given to you by your health care provider. Make sure you discuss any questions you have with your health care provider. Document Released: 12/16/2005 Document Revised: 01/06/2015 Document Reviewed: 03/16/2014 Elsevier Interactive Patient Education  2017 [place].   Diabetes and Foot Care Diabetes may cause you to have  problems because of poor blood supply (circulation) to your feet and legs. This may cause the skin on your feet to become thinner, break easier, and heal more slowly. Your skin may become dry, and the skin may peel and crack. You may also have nerve damage in your legs and feet causing decreased feeling in them. You may not notice minor injuries to your feet that could lead to infections or more serious problems. Taking care of your feet is one of the most important things you can do for yourself. Follow these instructions at home:  Wear shoes at all times, even in the house. Do not go barefoot. Bare feet are easily injured.  Check your feet daily for blisters, cuts, and redness. If you cannot see the bottom of your feet, use a mirror or ask someone for help.  Wash your feet with warm water (do not use hot water) and mild soap. Then pat your feet and the areas between your toes until they are completely dry. Do not soak your feet as this can dry your skin.  Apply a moisturizing lotion or petroleum jelly (that does not contain alcohol and is unscented) to the skin on your feet and to dry, brittle toenails. Do not apply lotion between your toes.  Trim your toenails straight across. Do not dig under them or around the cuticle. File the edges of your nails with an emery board or nail file.  Do not cut corns or calluses or try to remove them with medicine.  Wear clean socks or stockings every day. Make sure they are not too tight. Do not wear knee-high stockings since they may decrease blood flow to your legs.  Wear shoes that fit properly and have enough cushioning. To break in new shoes, wear them for just a few hours a day. This prevents you from injuring your feet. Always look in your shoes before you put them on to be sure there are no objects inside.  Do not cross your legs. This may decrease the blood flow to your feet.  If you find a minor scrape, cut, or break in the skin on your feet, keep  it and the skin around it clean and dry. These areas may be cleansed with mild soap and water. Do not cleanse the area with peroxide, alcohol, or iodine.  When you remove an adhesive bandage, be sure not to damage the skin around it.  If you have a wound, look at it several times a day to make sure it is healing.  Do not use heating pads or hot water bottles. They may burn your skin. If you have lost feeling in your feet or legs, you may not know it is happening until it is too late.  Make sure your health care provider performs a complete foot exam at least annually or more often if you have foot problems. Report any cuts, sores, or bruises to your health care provider immediately. Contact a health care provider if:  You have an injury that is not healing.  You have cuts or breaks in the skin.  You have an ingrown nail.  You notice redness on your legs or feet.  You feel burning or tingling in your legs or feet.  You have pain or cramps in your legs and feet.  Your legs or feet are numb.  Your feet always feel cold. Get help right away if:  There is increasing redness, swelling, or pain in or around a wound.  There is a red line that goes up your leg.  Pus is coming from a wound.  You develop a fever or as directed by your health care provider.  You notice a bad smell coming from an ulcer or wound. This information is not intended to replace advice given to you by your health care provider. Make sure you discuss any questions you have with your health care provider. Document Released: 12/13/2000 Document Revised: 05/23/2016 Document Reviewed: 05/25/2013 Elsevier Interactive Patient Education  2017 [place] for Type 2 Diabetes A screening test for type 2 diabetes (type 2 diabetes mellitus) is a blood test to measure your blood sugar (glucose) level. This test is done to check for early signs of diabetes, before you develop symptoms. Type 2 diabetes is a  long-term (chronic) disease that occurs when the pancreas does not make enough of a hormone called insulin. This results in high blood glucose levels, which can cause many complications. You may be screened for type 2 diabetes as part of your regular health care, especially if you have a high risk for diabetes. Screening can help identify type 2 diabetes at its early stage (prediabetes). Identifying and treating prediabetes may delay or prevent development of type 2 diabetes. What are the risk factors for type 2 diabetes? The following factors may make you more likely to develop type 2 diabetes:  Having a parent or sibling (first-degree relative) who  has diabetes.  Being overweight or obese.  Being of American-Indian, [place] Islander, Hispanic, Latino, Asian, or African-American descent.  Not getting enough exercise.  Being older than 45.  Having a history of diabetes during pregnancy (gestational diabetes).  Having low levels of good cholesterol (HDL-C) or high levels of blood fats (triglycerides).  Having high blood glucose in a previous blood test.  Having high blood pressure.  Having certain diseases or conditions, including:  Acanthosis nigricans. This is a condition that causes dark skin on the neck, armpits, and groin.  Polycystic ovary syndrome (PCOS).  Heart disease.  Having delivered a baby who weighed more than 9 lb (4.1 kg). Who should be screened for type 2 diabetes? Adults  Adults age 31 and older. These adults should be screened at least once every three years.  Adults who are younger than 45, overweight, and have at least one other risk factor. These adults should be screened at least once every three years.  Adults who have normal blood glucose levels and two or more risk factors. These adults may be screened once every year (annually).  Women who have had gestational diabetes in the past. These women should be screened at least once every three  years.  Pregnant women who have risk factors. These women should be screened at their first prenatal visit.  Pregnant women with no risk factors. These women should be screened between weeks 24 and 28 of pregnancy. Children and adolescents  Children and adolescents should be screened for type 2 diabetes if they are overweight and have 2 of the following risk factors:  A family history of type 2 diabetes.  Being a member of a high risk race or ethnic group.  Signs of insulin resistance or conditions associated with insulin resistance.  A mother who had gestational diabetes while pregnant with him or her.  Screening should be done at least once every three years, starting at age 31. Your health care provider or your child's health care provider may recommend having a screening more or less often. What happens during screening? During screening, your health care provider may ask questions about:  Your health and your risk factors, including your activity level and any medical conditions that you have.  The health of your first-degree relatives.  Past pregnancies, if this applies. Your health care provider will also do a physical exam, including a blood pressure measurement and blood tests. There are four blood tests that can be used to screen for type 2 diabetes. You may have one or more of the following:  A fasting plasma glucose test (FBG). You will not be allowed to eat for at least eight hours before a blood sample is taken.  A random blood glucose test. This test checks your blood glucose at any time of the day regardless of when you ate.  An oral glucose tolerance test (OGTT). This test measures your blood glucose at two times:  After you have not eaten (have fasted) overnight.  Two hours after you drink a glucose-containing beverage. A diagnosis can be made if the level is greater than 200 mg/dL (91.3 mmol/L).  An A1c test. This test provides information about blood glucose  control over the previous three months. What do the results mean? Your test results are a measurement of how much glucose is in your blood. Normal blood glucose levels mean that you do not have diabetes or prediabetes. High blood glucose levels may mean that you have prediabetes or diabetes. Depending on  the results, other tests may be needed to confirm the diagnosis. This information is not intended to replace advice given to you by your health care provider. Make sure you discuss any questions you have with your health care provider. Document Released: 10/12/2009 Document Revised: 05/23/2016 Document Reviewed: 10/13/2015 Elsevier Interactive Patient Education  2017 [employer]  Health Maintenance, Female Introduction Adopting a healthy lifestyle and getting preventive care can go a long way to promote health and wellness. Talk with your health care provider about what schedule of regular examinations is right for you. This is a good chance for you to check in with your provider about disease prevention and staying healthy. In between checkups, there are plenty of things you can do on your own. Experts have done a lot of research about which lifestyle changes and preventive measures are most likely to keep you healthy. Ask your health care provider for more information. Weight and diet Eat a healthy diet  Be sure to include plenty of vegetables, fruits, low-fat dairy products, and lean protein.  Do not eat a lot of foods high in solid fats, added sugars, or salt.  Get regular exercise. This is one of the most important things you can do for your health.  Most adults should exercise for at least 150 minutes each week. The exercise should increase your heart rate and make you sweat (moderate-intensity exercise).  Most adults should also do strengthening exercises at least twice a week. This is in addition to the moderate-intensity exercise. Maintain a healthy weight  Body mass index (BMI)  is a measurement that can be used to identify possible weight problems. It estimates body fat based on height and weight. Your health care provider can help determine your BMI and help you achieve or maintain a healthy weight.  For females 69 years of age and older:  A BMI below 18.5 is considered underweight.  A BMI of 18.5 to 24.9 is normal.  A BMI of 25 to 29.9 is considered overweight.  A BMI of 30 and above is considered obese. Watch levels of cholesterol and blood lipids  You should start having your blood tested for lipids and cholesterol at 81 years of age, then have this test every 5 years.  You may need to have your cholesterol levels checked more often if:  Your lipid or cholesterol levels are high.  You are older than 81 years of age.  You are at high risk for heart disease. Cancer screening Lung Cancer  Lung cancer screening is recommended for adults 69-17 years old who are at high risk for lung cancer because of a history of smoking.  A yearly low-dose CT scan of the lungs is recommended for people who:  Currently smoke.  Have quit within the past 15 years.  Have at least a 30-pack-year history of smoking. A pack year is smoking an average of one pack of cigarettes a day for 1 year.  Yearly screening should continue until it has been 15 years since you quit.  Yearly screening should stop if you develop a health problem that would prevent you from having lung cancer treatment. Breast Cancer  Practice breast self-awareness. This means understanding how your breasts normally appear and feel.  It also means doing regular breast self-exams. Let your health care provider know about any changes, no matter how small.  If you are in your 20s or 30s, you should have a clinical breast exam (CBE) by a health care provider every 1-3  years as part of a regular health exam.  If you are 72 or older, have a CBE every year. Also consider having a breast X-ray (mammogram)  every year.  If you have a family history of breast cancer, talk to your health care provider about genetic screening.  If you are at high risk for breast cancer, talk to your health care provider about having an MRI and a mammogram every year.  Breast cancer gene (BRCA) assessment is recommended for women who have family members with BRCA-related cancers. BRCA-related cancers include:  Breast.  Ovarian.  Tubal.  Peritoneal cancers.  Results of the assessment will determine the need for genetic counseling and BRCA1 and BRCA2 testing. Cervical Cancer  Your health care provider may recommend that you be screened regularly for cancer of the pelvic organs (ovaries, uterus, and vagina). This screening involves a pelvic examination, including checking for microscopic changes to the surface of your cervix (Pap test). You may be encouraged to have this screening done every 3 years, beginning at age 73.  For women ages 46-65, health care providers may recommend pelvic exams and Pap testing every 3 years, or they may recommend the Pap and pelvic exam, combined with testing for human papilloma virus (HPV), every 5 years. Some types of HPV increase your risk of cervical cancer. Testing for HPV may also be done on women of any age with unclear Pap test results.  Other health care providers may not recommend any screening for nonpregnant women who are considered low risk for pelvic cancer and who do not have symptoms. Ask your health care provider if a screening pelvic exam is right for you.  If you have had past treatment for cervical cancer or a condition that could lead to cancer, you need Pap tests and screening for cancer for at least 20 years after your treatment. If Pap tests have been discontinued, your risk factors (such as having a new sexual partner) need to be reassessed to determine if screening should resume. Some women have medical problems that increase the chance of getting cervical  cancer. In these cases, your health care provider may recommend more frequent screening and Pap tests. Colorectal Cancer  This type of cancer can be detected and often prevented.  Routine colorectal cancer screening usually begins at 81 years of age and continues through 81 years of age.  Your health care provider may recommend screening at an earlier age if you have risk factors for colon cancer.  Your health care provider may also recommend using home test kits to check for hidden blood in the stool.  A small camera at the end of a tube can be used to examine your colon directly (sigmoidoscopy or colonoscopy). This is done to check for the earliest forms of colorectal cancer.  Routine screening usually begins at age 56.  Direct examination of the colon should be repeated every 5-10 years through 81 years of age. However, you may need to be screened more often if early forms of precancerous polyps or small growths are found. Skin Cancer  Check your skin from head to toe regularly.  Tell your health care provider about any new moles or changes in moles, especially if there is a change in a mole's shape or color.  Also tell your health care provider if you have a mole that is larger than the size of a pencil eraser.  Always use sunscreen. Apply sunscreen liberally and repeatedly throughout the day.  Protect yourself by wearing  long sleeves, pants, a wide-brimmed hat, and sunglasses whenever you are outside. Heart disease, diabetes, and high blood pressure  High blood pressure causes heart disease and increases the risk of stroke. High blood pressure is more likely to develop in:  People who have blood pressure in the high end of the normal range (130-139/85-89 mm Hg).  People who are overweight or obese.  People who are African American.  If you are 99-43 years of age, have your blood pressure checked every 3-5 years. If you are 11 years of age or older, have your blood pressure  checked every year. You should have your blood pressure measured twice-once when you are at a hospital or clinic, and once when you are not at a hospital or clinic. Record the average of the two measurements. To check your blood pressure when you are not at a hospital or clinic, you can use:  An automated blood pressure machine at a pharmacy.  A home blood pressure monitor.  If you are between 81 years and 58 years old, ask your health care provider if you should take aspirin to prevent strokes.  Have regular diabetes screenings. This involves taking a blood sample to check your fasting blood sugar level.  If you are at a normal weight and have a low risk for diabetes, have this test once every three years after 81 years of age.  If you are overweight and have a high risk for diabetes, consider being tested at a younger age or more often. Preventing infection Hepatitis B  If you have a higher risk for hepatitis B, you should be screened for this virus. You are considered at high risk for hepatitis B if:  You were born in a country where hepatitis B is common. Ask your health care provider which countries are considered high risk.  Your parents were born in a high-risk country, and you have not been immunized against hepatitis B (hepatitis B vaccine).  You have HIV or AIDS.  You use needles to inject street drugs.  You live with someone who has hepatitis B.  You have had sex with someone who has hepatitis B.  You get hemodialysis treatment.  You take certain medicines for conditions, including cancer, organ transplantation, and autoimmune conditions. Hepatitis C  Blood testing is recommended for:  Everyone born from [birth year] through 1963.  Anyone with known risk factors for hepatitis C. Sexually transmitted infections (STIs)  You should be screened for sexually transmitted infections (STIs) including gonorrhea and chlamydia if:  You are sexually active and are younger than 81  years of age.  You are older than 81 years of age and your health care provider tells you that you are at risk for this type of infection.  Your sexual activity has changed since you were last screened and you are at an increased risk for chlamydia or gonorrhea. Ask your health care provider if you are at risk.  If you do not have HIV, but are at risk, it may be recommended that you take a prescription medicine daily to prevent HIV infection. This is called pre-exposure prophylaxis (PrEP). You are considered at risk if:  You are sexually active and do not regularly use condoms or know the HIV status of your partner(s).  You take drugs by injection.  You are sexually active with a partner who has HIV. Talk with your health care provider about whether you are at high risk of being infected with HIV. If you choose to  begin PrEP, you should first be tested for HIV. You should then be tested every 3 months for as long as you are taking PrEP. Pregnancy  If you are premenopausal and you may become pregnant, ask your health care provider about preconception counseling.  If you may become pregnant, take 400 to 800 micrograms (mcg) of folic acid every day.  If you want to prevent pregnancy, talk to your health care provider about birth control (contraception). Osteoporosis and menopause  Osteoporosis is a disease in which the bones lose minerals and strength with aging. This can result in serious bone fractures. Your risk for osteoporosis can be identified using a bone density scan.  If you are 33 years of age or older, or if you are at risk for osteoporosis and fractures, ask your health care provider if you should be screened.  Ask your health care provider whether you should take a calcium or vitamin D supplement to lower your risk for osteoporosis.  Menopause may have certain physical symptoms and risks.  Hormone replacement therapy may reduce some of these symptoms and risks. Talk to your  health care provider about whether hormone replacement therapy is right for you. Follow these instructions at home:  Schedule regular health, dental, and eye exams.  Stay current with your immunizations.  Do not use any tobacco products including cigarettes, chewing tobacco, or electronic cigarettes.  If you are pregnant, do not drink alcohol.  If you are breastfeeding, limit how much and how often you drink alcohol.  Limit alcohol intake to no more than 1 drink per day for nonpregnant women. One drink equals 12 ounces of beer, 5 ounces of wine, or 1 ounces of hard liquor.  Do not use street drugs.  Do not share needles.  Ask your health care provider for help if you need support or information about quitting drugs.  Tell your health care provider if you often feel depressed.  Tell your health care provider if you have ever been abused or do not feel safe at home. This information is not intended to replace advice given to you by your health care provider. Make sure you discuss any questions you have with your health care provider. Document Released: 07/01/2011 Document Revised: 05/23/2016 Document Reviewed: 09/19/2015  2017 Elsevier   Steps to Quit Smoking Smoking tobacco can be harmful to your health and can affect almost every organ in your body. Smoking puts you, and those around you, at risk for developing many serious chronic diseases. Quitting smoking is difficult, but it is one of the best things that you can do for your health. It is never too late to quit. What are the benefits of quitting smoking? When you quit smoking, you lower your risk of developing serious diseases and conditions, such as:  Lung cancer or lung disease, such as COPD.  Heart disease.  Stroke.  Heart attack.  Infertility.  Osteoporosis and bone fractures. Additionally, symptoms such as coughing, wheezing, and shortness of breath may get better when you quit. You may also find that you get  sick less often because your body is stronger at fighting off colds and infections. If you are pregnant, quitting smoking can help to reduce your chances of having a baby of low birth weight. How do I get ready to quit? When you decide to quit smoking, create a plan to make sure that you are successful. Before you quit:  Pick a date to quit. Set a date within the next two weeks  to give you time to prepare.  Write down the reasons why you are quitting. Keep this list in places where you will see it often, such as on your bathroom mirror or in your car or wallet.  Identify the people, places, things, and activities that make you want to smoke (triggers) and avoid them. Make sure to take these actions:  Throw away all cigarettes at home, at work, and in your car.  Throw away smoking accessories, such as Scientist, research (medical).  Clean your car and make sure to empty the ashtray.  Clean your home, including curtains and carpets.  Tell your family, friends, and coworkers that you are quitting. Support from your loved ones can make quitting easier.  Talk with your health care provider about your options for quitting smoking.  Find out what treatment options are covered by your health insurance. What strategies can I use to quit smoking? Talk with your healthcare provider about different strategies to quit smoking. Some strategies include:  Quitting smoking altogether instead of gradually lessening how much you smoke over a period of time. Research shows that quitting "cold [place]" is more successful than gradually quitting.  Attending in-person counseling to help you build problem-solving skills. You are more likely to have success in quitting if you attend several counseling sessions. Even short sessions of 10 minutes can be effective.  Finding resources and support systems that can help you to quit smoking and remain smoke-free after you quit. These resources are most helpful when you use  them often. They can include:  Online chats with a Social worker.  Telephone quitlines.  Printed Furniture conservator/restorer.  Support groups or group counseling.  Text messaging programs.  Mobile phone applications.  Taking medicines to help you quit smoking. (If you are pregnant or breastfeeding, talk with your health care provider first.) Some medicines contain nicotine and some do not. Both types of medicines help with cravings, but the medicines that include nicotine help to relieve withdrawal symptoms. Your health care provider may recommend:  Nicotine patches, gum, or lozenges.  Nicotine inhalers or sprays.  Non-nicotine medicine that is taken by mouth. Talk with your health care provider about combining strategies, such as taking medicines while you are also receiving in-person counseling. Using these two strategies together makes you more likely to succeed in quitting than if you used either strategy on its own. If you are pregnant or breastfeeding, talk with your health care provider about finding counseling or other support strategies to quit smoking. Do not take medicine to help you quit smoking unless told to do so by your health care provider. What things can I do to make it easier to quit? Quitting smoking might feel overwhelming at first, but there is a lot that you can do to make it easier. Take these important actions:  Reach out to your family and friends and ask that they support and encourage you during this time. Call telephone quitlines, reach out to support groups, or work with a counselor for support.  Ask people who smoke to avoid smoking around you.  Avoid places that trigger you to smoke, such as bars, parties, or smoke-break areas at work.  Spend time around people who do not smoke.  Lessen stress in your life, because stress can be a smoking trigger for some people. To lessen stress, try:  Exercising regularly.  Deep-breathing  exercises.  Yoga.  Meditating.  Performing a body scan. This involves closing your eyes, scanning your body from head  to toe, and noticing which parts of your body are particularly tense. Purposefully relax the muscles in those areas.  Download or purchase mobile phone or tablet apps (applications) that can help you stick to your quit plan by providing reminders, tips, and encouragement. There are many free apps, such as QuitGuide from the [employer] Office manager for Disease Control and Prevention). You can find other support for quitting smoking (smoking cessation) through smokefree.gov and other websites. How will I feel when I quit smoking? Within the first 24 hours of quitting smoking, you may start to feel some withdrawal symptoms. These symptoms are usually most noticeable 2-3 days after quitting, but they usually do not last beyond 2-3 weeks. Changes or symptoms that you might experience include:  Mood swings.  Restlessness, anxiety, or irritation.  Difficulty concentrating.  Dizziness.  Strong cravings for sugary foods in addition to nicotine.  Mild weight gain.  Constipation.  Nausea.  Coughing or a sore throat.  Changes in how your medicines work in your body.  A depressed mood.  Difficulty sleeping (insomnia). After the first 2-3 weeks of quitting, you may start to notice more positive results, such as:  Improved sense of smell and taste.  Decreased coughing and sore throat.  Slower heart rate.  Lower blood pressure.  Clearer skin.  The ability to breathe more easily.  Fewer sick days. Quitting smoking is very challenging for most people. Do not get discouraged if you are not successful the first time. Some people need to make many attempts to quit before they achieve long-term success. Do your best to stick to your quit plan, and talk with your health care provider if you have any questions or concerns. This information is not intended to replace advice given to  you by your health care provider. Make sure you discuss any questions you have with your health care provider. Document Released: 12/10/2001 Document Revised: 08/13/2016 Document Reviewed: 05/02/2015 Elsevier Interactive Patient Education  2017 [employer].

## 2017-01-22 NOTE — Progress Notes (Signed)
Subjective:    CC: DM  HPI: Diabetes - no hypoglycemic events. No wounds or sores that are not healing well. No increased thirst or urination. Checking glucose at home. Taking medications as prescribed without any side effects.  Hypertension- Pt denies chest pain, SOB, dizziness, or heart palpitations.  Taking meds as directed w/o problems.  Denies medication side effects.    Hypothyroidism-doing well. No pain or hair changes. No major changes in weight or energy levels.   Past medical history, Surgical history, Family history not pertinant except as noted below, Social history, Allergies, and medications have been entered into the medical record, reviewed, and corrections made.   Review of Systems: No fevers, chills, night sweats, weight loss, chest pain, or shortness of breath.   Objective:    General: Well Developed, well nourished, and in no acute distress.  Neuro: Alert and oriented x3, extra-ocular muscles intact, sensation grossly intact.  HEENT: Normocephalic, atraumatic  Skin: Warm and dry, no rashes. Cardiac: Regular rate and rhythm, no murmurs rubs or gallops, no lower extremity edema.  Respiratory: Clear to auscultation bilaterally. Not using accessory muscles, speaking in full sentences.   Impression and Recommendations:    DM- Well controlled. A1C of 6.7. Up a little from last time.  Work on [employer] and exercise.  Continue current regimen. Follow up in  3-4 months.   HTN - Well controlled. Continue current regimen. Follow up in  6 months.   Hypothyroidism-due to recheck TSH. As I last saw her she is actually been taking her medication on an empty stomach at least 4 hours away from her other medications. This may have affected the absorption so want to recheck her levels.

## 2017-01-22 NOTE — Progress Notes (Signed)
   Subjective:    Patient ID: Gina [name], female    DOB: [birth year], 83 y.o.   MRN: [id number]  HPI    Review of Systems     Objective:   Physical Exam        Assessment & Plan:

## 2017-01-22 NOTE — Telephone Encounter (Signed)
Please call place where had colonoscopy doen and get path report. We ned to find out when she was supposed to have repeart. Thank you.   Gina Lecher, MD

## 2017-01-22 NOTE — Progress Notes (Signed)
Agree with above. Note reviewed.  Will address colon cancer screening with her.   Beatrice Lecher, MD

## 2017-01-23 NOTE — Telephone Encounter (Signed)
Spoke with Pt, the last colonoscopy she had completed was on 09/24/2007. This report is in her chart. Pt states she is now 57 and is "over the age to get another one." There was a referral completed for Pt in 2013 for a colonoscopy, she did not have this completed.

## 2017-01-26 ENCOUNTER — Other Ambulatory Visit: Payer: Self-pay | Admitting: Family Medicine

## 2017-03-21 IMAGING — CR DG CHEST 2V
2 series · 2 of 2 positions shown · non-contrast
Comparison: None.

CLINICAL DATA: Cough.  Smoker.

EXAM:
CHEST  2 VIEW

[chest pa]
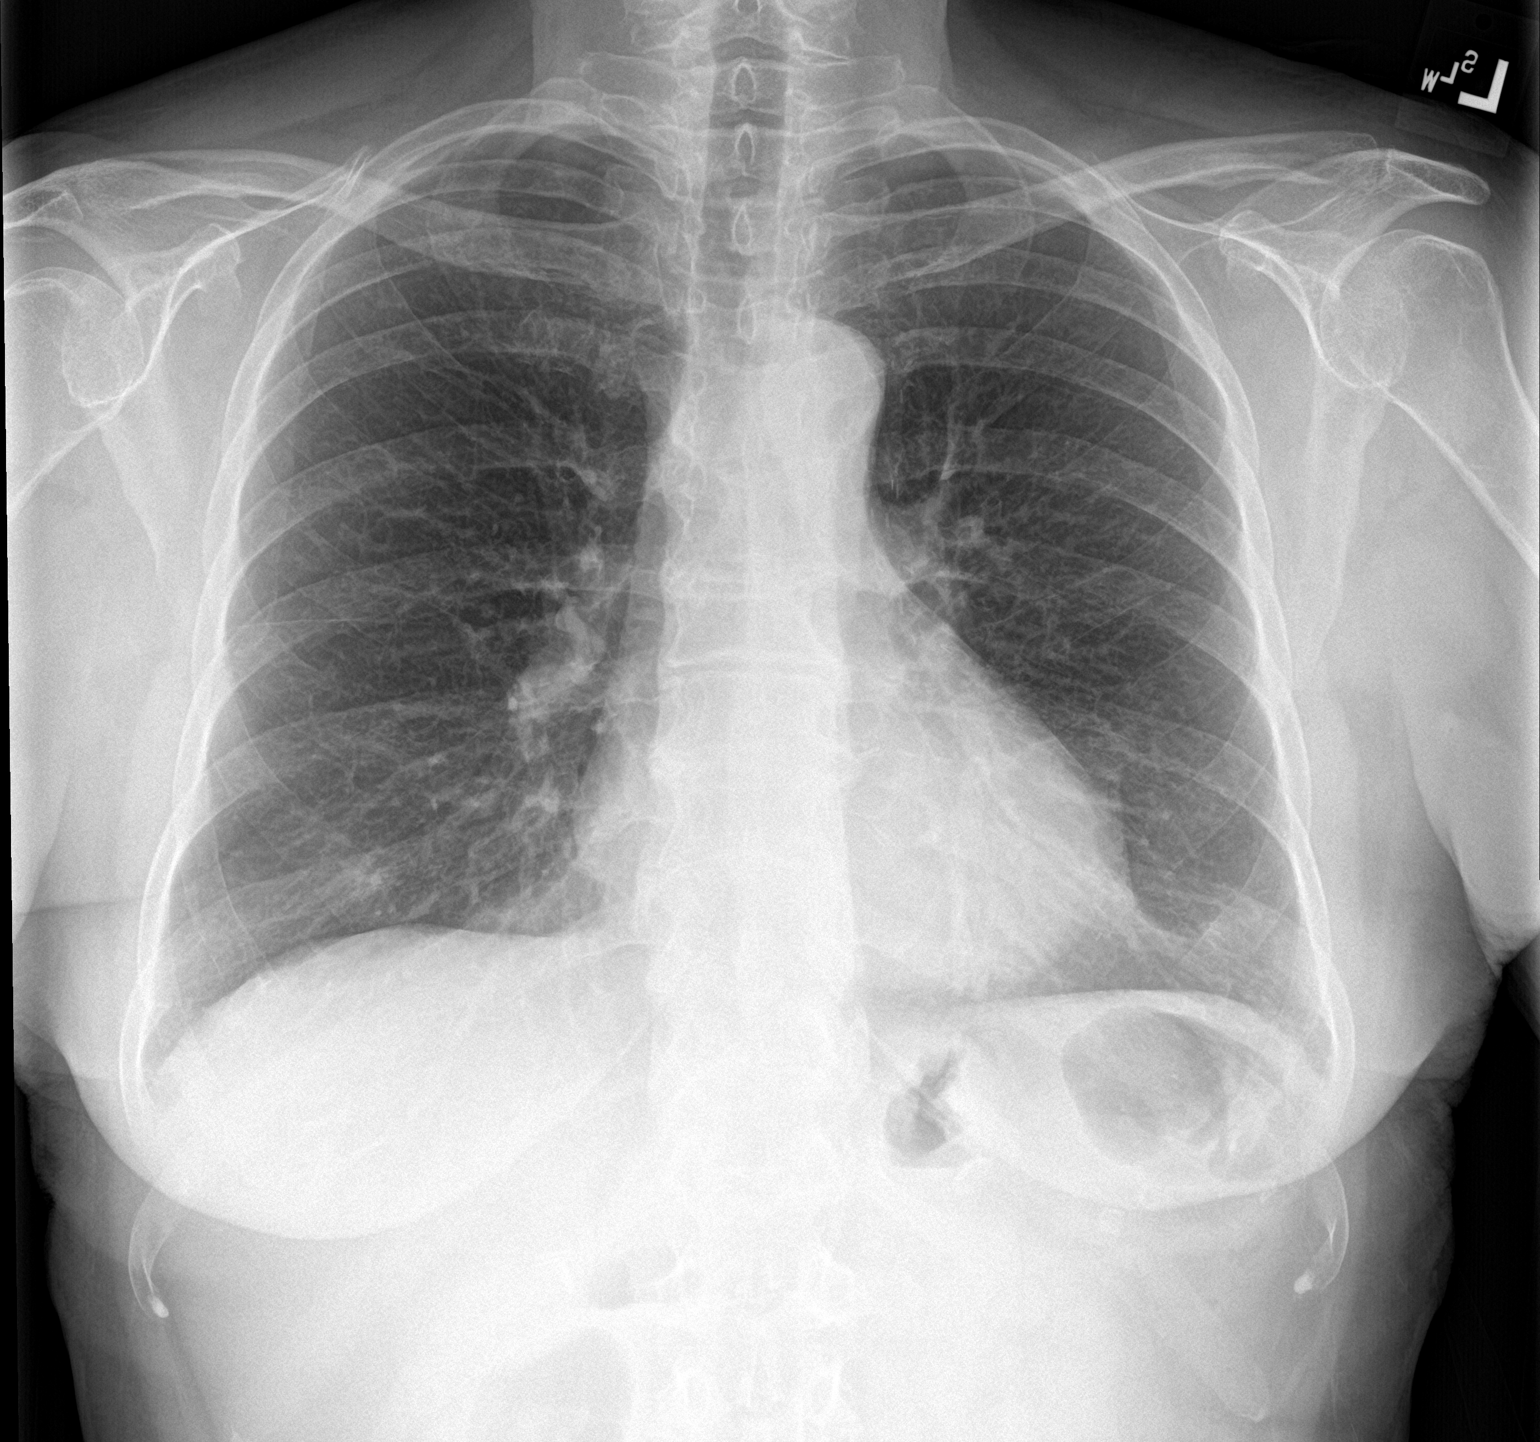

[chest lat]
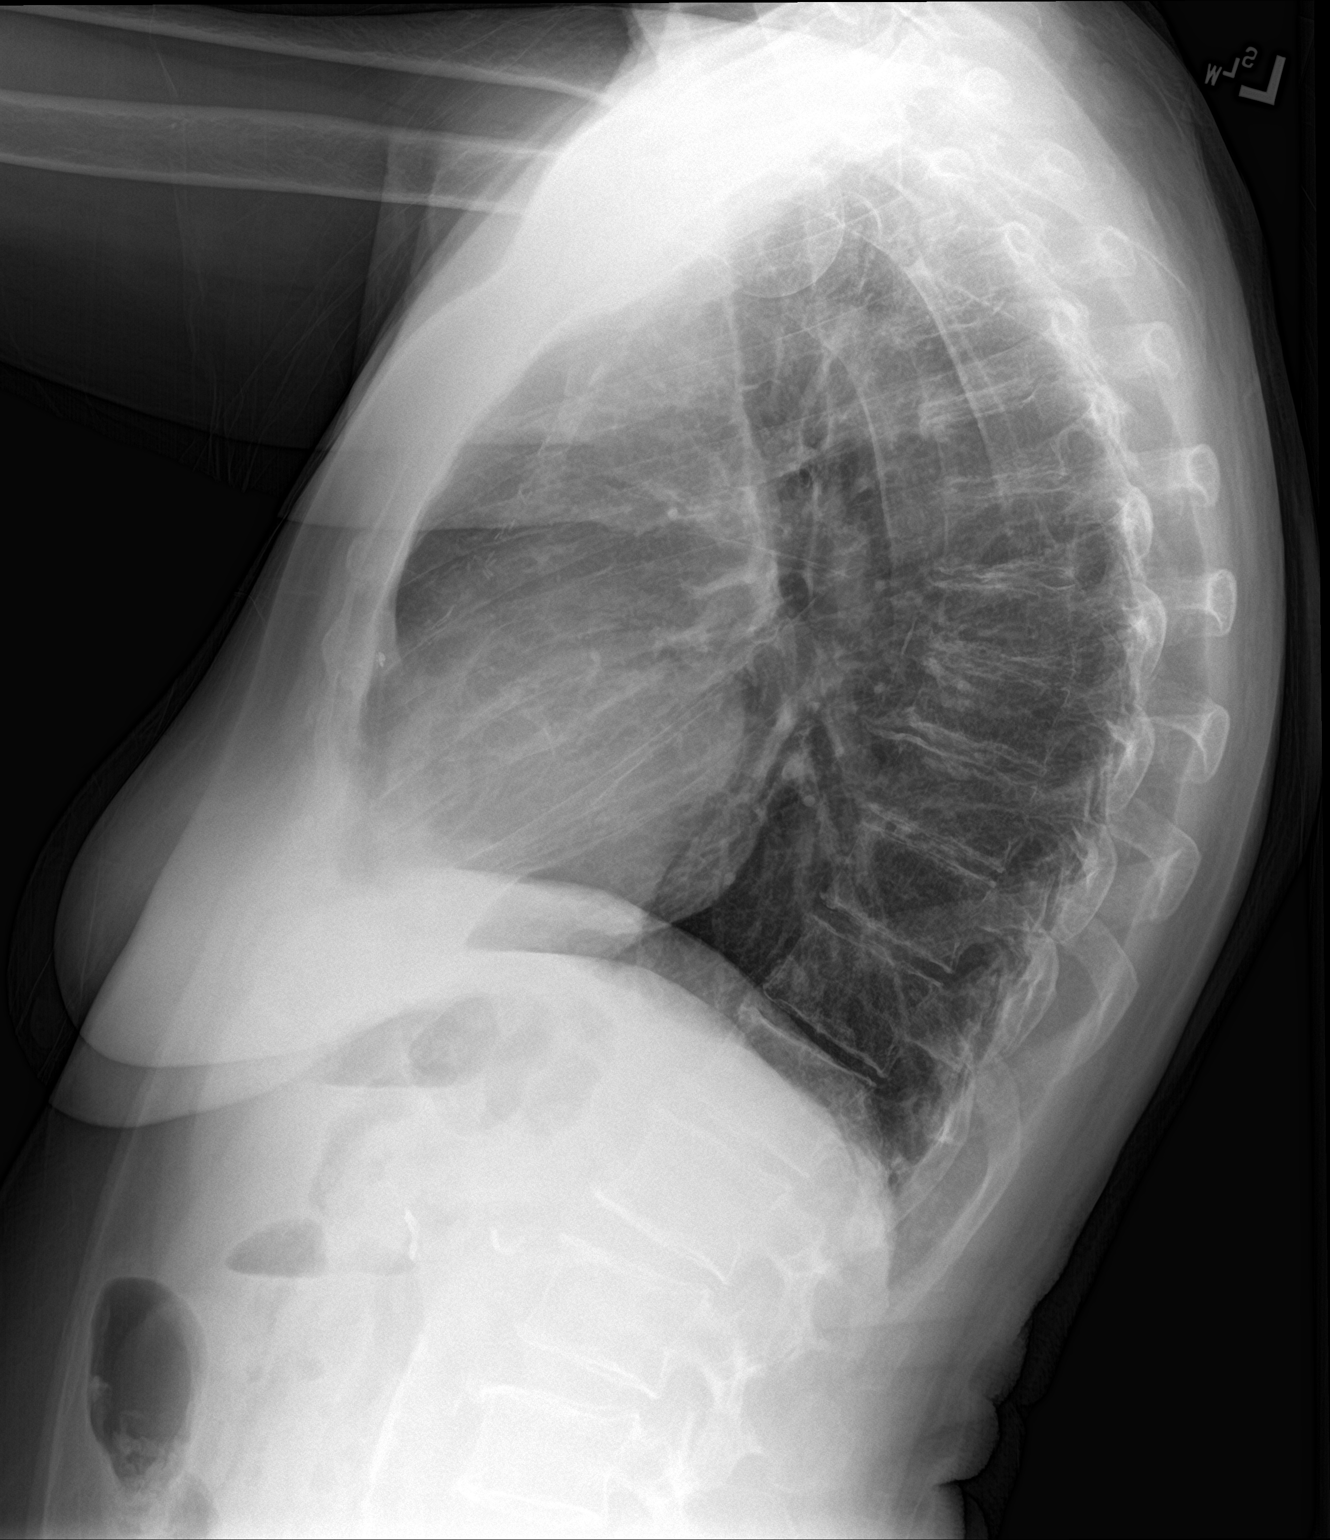

[2 of 2 positions shown; findings below may reference images not displayed]

FINDINGS: Surgical clips overlie the left anterior mediastinum. Normal heart
size. Normal mediastinal contour. No pneumothorax. No pleural
effusion. No pulmonary edema.
IMPRESSION: No active cardiopulmonary disease.

## 2017-04-23 ENCOUNTER — Encounter: Payer: Self-pay | Admitting: Family Medicine

## 2017-04-23 ENCOUNTER — Ambulatory Visit (INDEPENDENT_AMBULATORY_CARE_PROVIDER_SITE_OTHER): Payer: Medicare Other | Admitting: Family Medicine

## 2017-04-23 VITALS — BP 135/65 | HR 64 | Wt 154.0 lb

## 2017-04-23 DIAGNOSIS — I10 Essential (primary) hypertension: Secondary | ICD-10-CM | POA: Diagnosis not present

## 2017-04-23 DIAGNOSIS — E119 Type 2 diabetes mellitus without complications: Secondary | ICD-10-CM | POA: Diagnosis not present

## 2017-04-23 DIAGNOSIS — L570 Actinic keratosis: Secondary | ICD-10-CM | POA: Diagnosis not present

## 2017-04-23 DIAGNOSIS — L821 Other seborrheic keratosis: Secondary | ICD-10-CM

## 2017-04-23 LAB — POCT GLYCOSYLATED HEMOGLOBIN (HGB A1C): Hemoglobin A1C: 6.4

## 2017-04-23 NOTE — Progress Notes (Signed)
Subjective:    CC: DM, HTN   HPI:  Diabetes - no hypoglycemic events. No wounds or sores that are not healing well. No increased thirst or urination. Checking glucose at home. Taking medications as prescribed without any side effects.  Hypertension- Pt denies chest pain, SOB, dizziness, or heart palpitations.  Taking meds as directed w/o problems.  Denies medication side effects.    Assessment the lesion on her left facial cheek that has been there for about a month. She feels like it's just a dry flaky balm. It skin colored. Is not itchy or irritating but she's been trying to get away. She's been try to moisturize with Vaseline and also using things like vinegar and alcohol on it. She would like it removed.  Past medical history, Surgical history, Family history not pertinant except as noted below, Social history, Allergies, and medications have been entered into the medical record, reviewed, and corrections made.   Review of Systems: No fevers, chills, night sweats, weight loss, chest pain, or shortness of breath.   Objective:    General: Well Developed, well nourished, and in no acute distress.  Neuro: Alert and oriented x3, extra-ocular muscles intact, sensation grossly intact.  HEENT: Normocephalic, atraumatic  Skin: Warm and dry, no rashes.She does have a small lesion on her left facial cheek. It's most consistent with a elevated seborrheic keratosis apparently 5 x 6 mm in size and pink colored. She has several other seborrheic keratoses on her face that are more brown color. Cardiac: Regular rate and rhythm, no murmurs rubs or gallops, no lower extremity edema.  Respiratory: Clear to auscultation bilaterally. Not using accessory muscles, speaking in full sentences.   Impression and Recommendations:   DM- Well-controlled. Hemoglobin A1c down to 6.4 which is fantastic. Continue with healthy diet regular exercise and follow-up in 3-4 months. Foot exam performed today.  HTN - Well  controlled. Continue current regimen. Follow up in  3-51months.   Skin lesion of face-most consistent with a seborrheic keratosis-recommend cryotherapy. If the lesion does not resolve after cryotherapy and recommend shave biopsy to rule out basal cell since it is a more pink color but does have more dry rough texture were consistent with a seborrheic keratosis. Patient tolerated cryotherapy well. Follow-up wound care discussed.   Cryotherapy Procedure Note  Pre-operative Diagnosis: Actinic keratosis  Post-operative Diagnosis: Actinic keratosis  Locations: left facial cheek  Indications: irritation  Anesthesia: not required    Procedure Details  Patient informed of risks (permanent scarring, infection, light or dark discoloration, bleeding, infection, weakness, numbness and recurrence of the lesion) and benefits of the procedure and verbal informed consent obtained.  The areas are treated with liquid nitrogen therapy, frozen until ice ball extended 2 mm beyond lesion, allowed to thaw, and treated again. The patient tolerated procedure well.  The patient was instructed on post-op care, warned that there may be blister formation, redness and pain. Recommend OTC analgesia as needed for pain.  Condition: Stable  Complications: none.  Plan: 1. Instructed to keep the area dry and covered for 24-48h and clean thereafter. 2. Warning signs of infection were reviewed.   3. Recommended that the patient use OTC acetaminophen as needed for pain.  4. Return PRN

## 2017-04-27 ENCOUNTER — Other Ambulatory Visit: Payer: Self-pay | Admitting: Family Medicine

## 2017-05-02 ENCOUNTER — Other Ambulatory Visit: Payer: Self-pay | Admitting: Family Medicine

## 2017-05-03 ENCOUNTER — Emergency Department (INDEPENDENT_AMBULATORY_CARE_PROVIDER_SITE_OTHER)
Admission: EM | Admit: 2017-05-03 | Discharge: 2017-05-03 | Disposition: A | Discharge status: Home / Self Care | Payer: Medicare Other | Source: Home / Self Care | Attending: Family Medicine | Admitting: Family Medicine

## 2017-05-03 ENCOUNTER — Encounter: Payer: Self-pay | Admitting: Emergency Medicine

## 2017-05-03 DIAGNOSIS — R300 Dysuria: Secondary | ICD-10-CM | POA: Diagnosis not present

## 2017-05-03 DIAGNOSIS — R350 Frequency of micturition: Secondary | ICD-10-CM | POA: Diagnosis not present

## 2017-05-03 MED ORDER — CEPHALEXIN 500 MG PO CAPS: 500.0000 mg | ORAL_CAPSULE | Freq: Two times a day (BID) | ORAL | 0 refills | Status: DC

## 2017-05-03 NOTE — ED Provider Notes (Signed)
CSN: [id number]     Arrival date & time 05/03/17  1146 History   First MD Initiated Contact with Patient 05/03/17 1226     Chief Complaint  Patient presents with  . Recurrent UTI   (Consider location/radiation/quality/duration/timing/severity/associated sxs/prior Treatment) HPI  Gina [name] is a 83 y.o. female presenting to UC with c/o urinary urgency and frequency for 2 days. She has taken Uricalm Max with mild relief.  Denies blood in urine. Mild lower abdominal discomfort and lower back soreness. Denies fever, chills, n/v/d. Last UTI was over 6 months ago.  She notes she was seen by a urologist several years ago and was told her bladder does not fully empty.  She takes cranberry pills daily, is sure to wash carefully after having a bowel movement and tries to stay well hydrated to help prevent bladder infection.  Pt notes her mother became septic from a UTI and she hopes to never have a UTI get that bad.     Past Medical History:  Diagnosis Date  . CAD (coronary artery disease)   . Colon cancer [facility])    Chemotherapy  . High cholesterol   . HTN (hypertension)   . Hypothyroidism   . T2DM (type 2 diabetes mellitus) ([place])    Past Surgical History:  Procedure Laterality Date  . APPENDECTOMY    . CABG     1998.   Marland Kitchen CATARACT EXTRACTION, BILATERAL    . COLECTOMY  2003   for cancer. partial  . CORONARY STENT PLACEMENT  2008  . LAPAROSCOPIC CHOLECYSTECTOMY     Family History  Problem Relation Age of Onset  . Cancer Daughter   . Diabetes Mother   . Coronary artery disease Father     Died of MI at age 29  . Cancer Brother   . Alzheimer's disease Maternal Aunt   . Heart disease Paternal Aunt   . Heart disease Paternal Uncle   . Kidney disease Maternal Grandfather   . Heart disease Paternal Grandmother   . Stroke Paternal Grandfather    Social History  Substance Use Topics  . Smoking status: Current Every Day Smoker    Packs/day: 0.25    Years: 60.00    Types:  Cigarettes  . Smokeless tobacco: Never Used     Comment: 5 cigarettes/day   . Alcohol use Yes     Comment: Occasional glass of wine   OB History    No data available     Review of Systems  Constitutional: Negative for chills and fever.  Gastrointestinal: Positive for abdominal pain ( lower discomfort). Negative for diarrhea, nausea and vomiting.  Genitourinary: Positive for dysuria, frequency and urgency. Negative for decreased urine volume, hematuria and pelvic pain.  Musculoskeletal: Positive for back pain. Negative for arthralgias and myalgias.    Allergies  Chlorthalidone; Metoprolol; and Sulfonamide derivatives  Home Medications   Prior to Admission medications   Medication Sig Start Date End Date Taking? Authorizing Provider  AMBULATORY NON FORMULARY MEDICATION Medication Name: Glucose meter strips ( [employer] brand). Test once a day . Dx. 250.0 10/14/13   Hali Marry, MD  AMBULATORY NON FORMULARY MEDICATION Medication Name: Reli-On Tacey Ruiz blood glucose test strip. Test 1 x a day.  Dx. 250.00 04/18/14   Hali Marry, MD  amLODipine-benazepril (LOTREL) 5-10 MG capsule Take 1 capsule by mouth daily. NEED FOLLOW UP VISIT FOR MORE REFILLS 01/28/17   Hali Marry, MD  aspirin 325 MG tablet Take 325 mg by mouth daily.  [provider]  atorvastatin (LIPITOR) 40 MG tablet TAKE 1 TABLET BY MOUTH ONCE DAILY 05/02/17   Hali Marry, MD  cephALEXin (KEFLEX) 500 MG capsule Take 1 capsule (500 mg total) by mouth 2 (two) times daily. 05/03/17   Noland Fordyce, PA-C  glimepiride (AMARYL) 4 MG tablet TAKE 1 TABLET (4 MG TOTAL) BY MOUTH DAILY WITH BREAKFAST. 04/28/17   Hali Marry, MD  levothyroxine (SYNTHROID, LEVOTHROID) 100 MCG tablet TAKE 1 TABLET BY MOUTH DAILY 10/31/16   Hali Marry, MD  metFORMIN (GLUCOPHAGE) 500 MG tablet Take 1 tablet (500 mg total) by mouth 2 (two) times daily with a meal. 06/24/16   Hali Marry, MD   Multiple Vitamins-Minerals (PRESERVISION AREDS 2) CAPS Take 1 capsule by mouth daily.    [provider]  nitroGLYCERIN (NITROSTAT) 0.4 MG SL tablet Place 1 tablet (0.4 mg total) under the tongue every 5 (five) minutes as needed. 05/08/15   Hali Marry, MD   Meds Ordered and Administered this Visit  Medications - No data to display  BP 132/75 (BP Location: Left Arm)   Pulse 60   Temp 98.6 F (37 C) (Oral)   Ht 5\' 4"  (1.626 m)   Wt 153 lb (69.4 kg)   SpO2 96%   BMI 26.26 kg/m  No data found.   Physical Exam  Constitutional: She is oriented to person, place, and time. She appears well-developed and well-nourished. No distress.  HENT:  Head: Normocephalic and atraumatic.  Mouth/Throat: Oropharynx is clear and moist.  Eyes: EOM are normal.  Neck: Normal range of motion.  Cardiovascular: Normal rate and regular rhythm.   Pulmonary/Chest: Effort normal and breath sounds normal. No respiratory distress. She has no wheezes. She has no rales.  Abdominal: Soft. She exhibits no distension and no mass. There is no tenderness. There is no rebound, no guarding and no CVA tenderness.  Musculoskeletal: Normal range of motion.  Neurological: She is alert and oriented to person, place, and time.  Skin: Skin is warm and dry. She is not diaphoretic.  Psychiatric: She has a normal mood and affect. Her behavior is normal.  Nursing note and vitals reviewed.   Urgent Care Course     Procedures (including critical care time)  Labs Review Labs Reviewed  URINE CULTURE    Imaging Review No results found.    MDM   1. Dysuria   2. Urinary frequency    Unable to perform UA in clinic due to recent use of Uricalm Max   Will send urine culture  Will start pt on Keflex while culture is pending. Encouraged f/u with PCP later this week.   Noland Fordyce, PA-C 05/03/17 1320

## 2017-05-03 NOTE — ED Triage Notes (Signed)
Pt c/o urgency and frequency x 2 days, taking OTC Uricalm Max, no blood.

## 2017-05-05 LAB — URINE CULTURE

## 2017-05-06 ENCOUNTER — Telehealth: Payer: Self-pay | Admitting: *Deleted

## 2017-05-06 ENCOUNTER — Telehealth: Payer: Self-pay

## 2017-05-06 DIAGNOSIS — N390 Urinary tract infection, site not specified: Secondary | ICD-10-CM

## 2017-05-06 NOTE — Telephone Encounter (Signed)
Callback: No answer, LMOM to return call to office.

## 2017-05-06 NOTE — Telephone Encounter (Signed)
OK to place referral

## 2017-05-06 NOTE — Telephone Encounter (Signed)
Referral placed.

## 2017-05-06 NOTE — Telephone Encounter (Signed)
Pt came in today asking for a urology referral.  She stated that she has been having frequent UTIs, and was seen in UC on Sat with one.  She said that before she left [place] 7 years ago, she had a work up with urology, but doesn't remember what the said.  She saw Dr. Charisse Klinefelter there.  Please advise about referral.

## 2017-05-08 NOTE — Telephone Encounter (Signed)
Spoke with patient is feeling much better following UTI

## 2017-05-19 ENCOUNTER — Telehealth: Payer: Self-pay

## 2017-05-19 NOTE — Telephone Encounter (Signed)
Called patient back, she is seeing the urologist on Thursday, and she will try to wait until then.

## 2017-05-19 NOTE — Telephone Encounter (Signed)
OK for nurse visit

## 2017-05-19 NOTE — Telephone Encounter (Signed)
Pt was seen in UC earlier this month 5/5, finished antibiotic last Saturday 5/12.  Pt is having same symptoms as before, has been taking AZO to make it through.  Does she need an appointment with you?  Or can she come in for a nurse visit.  Please advise.  She has an appointment with a urologist on 5/30.

## 2017-05-22 ENCOUNTER — Ambulatory Visit (INDEPENDENT_AMBULATORY_CARE_PROVIDER_SITE_OTHER): Payer: Medicare Other | Admitting: Family Medicine

## 2017-05-22 VITALS — BP 112/58 | HR 61 | Temp 97.7°F

## 2017-05-22 DIAGNOSIS — R300 Dysuria: Secondary | ICD-10-CM | POA: Diagnosis not present

## 2017-05-22 DIAGNOSIS — N3000 Acute cystitis without hematuria: Secondary | ICD-10-CM | POA: Diagnosis not present

## 2017-05-22 LAB — POCT URINALYSIS DIPSTICK
BILIRUBIN UA: NEGATIVE
GLUCOSE UA: 100
Leukocytes, UA: NEGATIVE
Nitrite, UA: POSITIVE
PH UA: 5 (ref 5.0–8.0)
RBC UA: NEGATIVE
Spec Grav, UA: 1.01 (ref 1.010–1.025)
Urobilinogen, UA: 1 E.U./dL

## 2017-05-22 MED ORDER — CIPROFLOXACIN HCL 500 MG PO TABS: 500.0000 mg | ORAL_TABLET | Freq: Two times a day (BID) | ORAL | 0 refills | Status: AC

## 2017-05-22 NOTE — Progress Notes (Signed)
Pt in for recheck UA & culture.

## 2017-05-22 NOTE — Progress Notes (Signed)
Since still having symptoms for urinary tract infection after treatment with Keflex. Repeat urinalysis still shows nitrates today. We'll go ahead and send in a different antibiotic and will send for culture for confirmation. Will place patient on ciprofloxacin.

## 2017-05-24 LAB — URINE CULTURE

## 2017-05-28 ENCOUNTER — Other Ambulatory Visit: Payer: Self-pay | Admitting: Family Medicine

## 2017-05-28 DIAGNOSIS — E119 Type 2 diabetes mellitus without complications: Secondary | ICD-10-CM

## 2017-07-09 DIAGNOSIS — N390 Urinary tract infection, site not specified: Secondary | ICD-10-CM | POA: Diagnosis not present

## 2017-08-21 ENCOUNTER — Encounter: Payer: Self-pay | Admitting: Family Medicine

## 2017-08-21 ENCOUNTER — Ambulatory Visit (INDEPENDENT_AMBULATORY_CARE_PROVIDER_SITE_OTHER): Payer: Medicare Other | Admitting: Family Medicine

## 2017-08-21 VITALS — BP 113/56 | HR 56 | Wt 153.0 lb

## 2017-08-21 DIAGNOSIS — E119 Type 2 diabetes mellitus without complications: Secondary | ICD-10-CM

## 2017-08-21 DIAGNOSIS — W010XXA Fall on same level from slipping, tripping and stumbling without subsequent striking against object, initial encounter: Secondary | ICD-10-CM | POA: Diagnosis not present

## 2017-08-21 DIAGNOSIS — I10 Essential (primary) hypertension: Secondary | ICD-10-CM

## 2017-08-21 DIAGNOSIS — S20212A Contusion of left front wall of thorax, initial encounter: Secondary | ICD-10-CM

## 2017-08-21 DIAGNOSIS — N952 Postmenopausal atrophic vaginitis: Secondary | ICD-10-CM | POA: Diagnosis not present

## 2017-08-21 LAB — POCT UA - MICROALBUMIN
Albumin/Creatinine Ratio, Urine, POC: <30
Creatinine, POC: 50 mg/dL
Microalbumin Ur, POC: 10 mg/L

## 2017-08-21 LAB — POCT GLYCOSYLATED HEMOGLOBIN (HGB A1C): HEMOGLOBIN A1C: 9.3

## 2017-08-21 MED ORDER — ATORVASTATIN CALCIUM 40 MG PO TABS: 40.0000 mg | ORAL_TABLET | Freq: Every day | ORAL | 6 refills | Status: DC

## 2017-08-21 MED ORDER — ESTRADIOL 0.1 MG/GM VA CREA: TOPICAL_CREAM | VAGINAL | 12 refills | Status: DC

## 2017-08-21 NOTE — Progress Notes (Signed)
Subjective:    Patient ID: Gina [name], female    DOB: 06-20-1934, 83 y.o.   MRN: [id number]  HPI Diabetes - no hypoglycemic events. No wounds or sores that are not healing well. No increased thirst or urination. Checking glucose at home. Taking medications as prescribed without any side effects.  Hypertension- Pt denies chest pain, SOB, dizziness, or heart palpitations.  Taking meds as directed w/o problems.  Denies medication side effects.    She also complains that she fell last Thursday. She said she was stepping off of her area rug onto the hardwood floors in her home and stumbled. She fell and hit both knees and then toppled forward and actually hit her left chest just below her left breast. She thinks it's just bruised and doesn't think it's fractured but would like me to look at it today. She does have pain with coughing or sneezing but it's not keeping her awake at night. It is uncomfortable to wear a bra.    She did go to Urology  On 7/1 and saw Dr. Elnoria Howard. She was retaining extra urine in her bladder.  They taught her techniques to better empty out her bladder.  She mentioned to him that she was having some soreness and occ itching in the creases near her vagina. He told her it would repsonde to a steroid cream and to see her Gyn for it.     Review of Systems  BP (!) 113/56   Pulse (!) 56   Wt 153 lb (69.4 kg)   SpO2 99%   BMI 26.26 kg/m     Allergies  Allergen Reactions  . Chlorthalidone Other (See Comments)    Extreme fatigue and weakness.   . Metoprolol Other (See Comments)    Dizziness.   . Sulfonamide Derivatives     Past Medical History:  Diagnosis Date  . CAD (coronary artery disease)   . Colon cancer [facility])    Chemotherapy  . High cholesterol   . HTN (hypertension)   . Hypothyroidism   . T2DM (type 2 diabetes mellitus) ([place])     Past Surgical History:  Procedure Laterality Date  . APPENDECTOMY    . CABG     1998.   Marland Kitchen CATARACT EXTRACTION,  BILATERAL    . COLECTOMY  2003   for cancer. partial  . CORONARY STENT PLACEMENT  2008  . LAPAROSCOPIC CHOLECYSTECTOMY      Social History   Social History  . Marital status: Married    Spouse name: N/A  . Number of children: 4  . Years of education: N/A   Occupational History  .      Retired   Social History Main Topics  . Smoking status: Current Every Day Smoker    Packs/day: 0.25    Years: 60.00    Types: Cigarettes  . Smokeless tobacco: Never Used     Comment: 5 cigarettes/day   . Alcohol use Yes     Comment: Occasional glass of wine  . Drug use: No  . Sexual activity: Not Currently   Other Topics Concern  . Not on file   Social History Narrative   Married, 4 grown kids. Moved from [place], no regular exercise.     Family History  Problem Relation Age of Onset  . Cancer Daughter   . Diabetes Mother   . Coronary artery disease Father        Died of MI at age 26  . Cancer Brother   .  Alzheimer's disease Maternal Aunt   . Heart disease Paternal Aunt   . Heart disease Paternal Uncle   . Kidney disease Maternal Grandfather   . Heart disease Paternal Grandmother   . Stroke Paternal Grandfather     Outpatient Encounter Prescriptions as of 08/21/2017  Medication Sig  . AMBULATORY NON FORMULARY MEDICATION Medication Name: Glucose meter strips ( [employer] brand). Test once a day . Dx. 250.0  . AMBULATORY NON FORMULARY MEDICATION Medication Name: Reli-On Ultia blood glucose test strip. Test 1 x a day.  Dx. 250.00  . amLODipine-benazepril (LOTREL) 5-10 MG capsule Take 1 capsule by mouth daily.  Marland Kitchen aspirin 325 MG tablet Take 325 mg by mouth daily.    Marland Kitchen atorvastatin (LIPITOR) 40 MG tablet Take 1 tablet (40 mg total) by mouth daily.  . Cranberry-Vitamin C (CRANBERRY CONCENTRATE/VITAMINC) 15000-100 MG CAPS Take 1 capsule by mouth.  . Garlic 1950 MG CAPS Take 1 capsule by mouth.  Marland Kitchen glimepiride (AMARYL) 4 MG tablet TAKE 1 TABLET (4 MG TOTAL) BY MOUTH DAILY WITH  BREAKFAST.  Marland Kitchen levothyroxine (SYNTHROID, LEVOTHROID) 100 MCG tablet TAKE 1 TABLET BY MOUTH DAILY  . metFORMIN (GLUCOPHAGE) 500 MG tablet TAKE 1 TABLET (500 MG TOTAL) BY MOUTH 2 (TWO) TIMES DAILY WITH A MEAL.  . Multiple Vitamins-Minerals (PRESERVISION AREDS 2) CAPS Take 1 capsule by mouth daily.  . nitroGLYCERIN (NITROSTAT) 0.4 MG SL tablet Place 1 tablet (0.4 mg total) under the tongue every 5 (five) minutes as needed.  . [DISCONTINUED] atorvastatin (LIPITOR) 40 MG tablet TAKE 1 TABLET BY MOUTH ONCE DAILY  . estradiol (ESTRACE) 0.1 MG/GM vaginal cream 1 application to vaginal external area twice a week at bedtime.   No facility-administered encounter medications on file as of 08/21/2017.          Objective:   Physical Exam  Constitutional: She is oriented to person, place, and time. She appears well-developed and well-nourished.  HENT:  Head: Normocephalic and atraumatic.  Cardiovascular: Normal rate, regular rhythm and normal heart sounds.   Pulmonary/Chest: Effort normal and breath sounds normal.    Neurological: She is alert and oriented to person, place, and time.  Skin: Skin is warm and dry.  Psychiatric: She has a normal mood and affect. Her behavior is normal.          Assessment & Plan:  DM- point-of-care hemoglobin A1c came back elevated at 9.3. This doesn't seem consistent with a home blood sugars that she's been getting. Will have her go downstairs for the new puncture to confirm the lab results. In the meantime we'll continue current regimen. Urine microalbumin done today.follow-up in 3 months.  HTN - Well controlled. Continue current regimen. Follow up in  6 months.   Contusion wall of left chest - unlikely fracture.  Gave reassurance   Vaginal atrophy - will try estrace vaginal ceam twice a week at bedtime.  F/U in 3 months.  If not helping then consider gyn referral. She doesn't wan to see "another physician".    Recent fall - she feels it is because she has  been shuffling her feet more. Her daughter had noticed. She says she has been trying to work on this and be more aware that she is doing it.    Declined flu.

## 2017-08-22 LAB — BASIC METABOLIC PANEL WITH GFR
BUN: 11 mg/dL (ref 7–25)
CALCIUM: 9.4 mg/dL (ref 8.6–10.4)
CO2: 18 mmol/L — ABNORMAL LOW (ref 20–32)
Chloride: 104 mmol/L (ref 98–110)
Creat: 0.76 mg/dL (ref 0.60–0.88)
GFR, EST AFRICAN AMERICAN: 86 mL/min (ref >=60)
GFR, Est Non African American: 74 mL/min (ref >=60)
GLUCOSE: 113 mg/dL — AB (ref 65–99)
Potassium: 4.2 mmol/L (ref 3.5–5.3)
Sodium: 138 mmol/L (ref 135–146)

## 2017-08-22 LAB — HEMOGLOBIN A1C
HEMOGLOBIN A1C: 6.3 % — AB (ref <5.7)
Mean Plasma Glucose: 134 mg/dL

## 2017-08-25 ENCOUNTER — Other Ambulatory Visit: Payer: Self-pay | Admitting: *Deleted

## 2017-08-25 MED ORDER — GLIMEPIRIDE 4 MG PO TABS: ORAL_TABLET | ORAL | 1 refills | Status: DC

## 2017-09-05 DIAGNOSIS — H59033 Cystoid macular edema following cataract surgery, bilateral: Secondary | ICD-10-CM | POA: Diagnosis not present

## 2017-09-26 ENCOUNTER — Other Ambulatory Visit: Payer: Self-pay | Admitting: Family Medicine

## 2017-09-30 DIAGNOSIS — H353221 Exudative age-related macular degeneration, left eye, with active choroidal neovascularization: Secondary | ICD-10-CM | POA: Diagnosis not present

## 2017-09-30 DIAGNOSIS — H353111 Nonexudative age-related macular degeneration, right eye, early dry stage: Secondary | ICD-10-CM | POA: Diagnosis not present

## 2017-09-30 DIAGNOSIS — H35352 Cystoid macular degeneration, left eye: Secondary | ICD-10-CM | POA: Diagnosis not present

## 2017-09-30 DIAGNOSIS — E119 Type 2 diabetes mellitus without complications: Secondary | ICD-10-CM | POA: Diagnosis not present

## 2017-09-30 LAB — HM DIABETES EYE EXAM

## 2017-10-27 DIAGNOSIS — H353221 Exudative age-related macular degeneration, left eye, with active choroidal neovascularization: Secondary | ICD-10-CM | POA: Diagnosis not present

## 2017-11-03 ENCOUNTER — Encounter: Payer: Self-pay | Admitting: Family Medicine

## 2017-11-24 ENCOUNTER — Other Ambulatory Visit: Payer: Self-pay | Admitting: Family Medicine

## 2017-11-24 ENCOUNTER — Other Ambulatory Visit: Payer: Self-pay | Admitting: *Deleted

## 2017-11-24 DIAGNOSIS — E119 Type 2 diabetes mellitus without complications: Secondary | ICD-10-CM

## 2017-11-24 MED ORDER — ATORVASTATIN CALCIUM 40 MG PO TABS: 40.0000 mg | ORAL_TABLET | Freq: Every day | ORAL | 3 refills | Status: DC

## 2017-11-28 DIAGNOSIS — H353221 Exudative age-related macular degeneration, left eye, with active choroidal neovascularization: Secondary | ICD-10-CM | POA: Diagnosis not present

## 2017-12-12 ENCOUNTER — Encounter: Payer: Self-pay | Admitting: Family Medicine

## 2017-12-31 ENCOUNTER — Ambulatory Visit (INDEPENDENT_AMBULATORY_CARE_PROVIDER_SITE_OTHER): Payer: Medicare Other | Admitting: Family Medicine

## 2017-12-31 ENCOUNTER — Other Ambulatory Visit: Payer: Self-pay

## 2017-12-31 ENCOUNTER — Encounter: Payer: Self-pay | Admitting: Family Medicine

## 2017-12-31 ENCOUNTER — Telehealth: Payer: Self-pay | Admitting: Family Medicine

## 2017-12-31 VITALS — BP 125/60 | HR 72 | Ht 64.0 in | Wt 151.0 lb

## 2017-12-31 DIAGNOSIS — H3530 Unspecified macular degeneration: Secondary | ICD-10-CM | POA: Diagnosis not present

## 2017-12-31 DIAGNOSIS — I10 Essential (primary) hypertension: Secondary | ICD-10-CM | POA: Diagnosis not present

## 2017-12-31 DIAGNOSIS — I2510 Atherosclerotic heart disease of native coronary artery without angina pectoris: Secondary | ICD-10-CM | POA: Diagnosis not present

## 2017-12-31 DIAGNOSIS — E119 Type 2 diabetes mellitus without complications: Secondary | ICD-10-CM | POA: Diagnosis not present

## 2017-12-31 LAB — POCT GLYCOSYLATED HEMOGLOBIN (HGB A1C): HEMOGLOBIN A1C: 6.3

## 2017-12-31 MED ORDER — AMLODIPINE BESY-BENAZEPRIL HCL 5-10 MG PO CAPS: 1.0000 | ORAL_CAPSULE | Freq: Every day | ORAL | 1 refills | Status: AC

## 2017-12-31 MED ORDER — GLIMEPIRIDE 4 MG PO TABS: ORAL_TABLET | ORAL | 1 refills | Status: AC

## 2017-12-31 MED ORDER — METFORMIN HCL 500 MG PO TABS: 500.0000 mg | ORAL_TABLET | Freq: Two times a day (BID) | ORAL | 1 refills | Status: AC

## 2017-12-31 MED ORDER — ATORVASTATIN CALCIUM 40 MG PO TABS: 40.0000 mg | ORAL_TABLET | Freq: Every day | ORAL | 3 refills | Status: AC

## 2017-12-31 MED ORDER — LEVOTHYROXINE SODIUM 100 MCG PO TABS: 100.0000 ug | ORAL_TABLET | Freq: Every day | ORAL | 1 refills | Status: AC

## 2017-12-31 NOTE — Telephone Encounter (Signed)
Please call patient and let her know that I did look to see who the providers were at the [facility] in [place].  She is wanting to stay closer to home because she is having more difficulty driving.  Unfortunately I was not familiar with any of the providers there to make a recommendation.  She might want to talk to neighbors and friends to see if she knows any one that goes there and if they have someone that they really like.

## 2017-12-31 NOTE — Progress Notes (Signed)
Subjective:    Patient ID: Gina [name], female    DOB: 1934/06/16, 84 y.o.   MRN: [id number]  HPI  Diabetes -that she is only had one hypoglycemic event since I last saw her.  She says she started to feel a little sweaty and then ate something and felt better but did not actually check her glucose when it happened.  No wounds or sores that are not healing well. No increased thirst or urination. Checking glucose at home. Taking medications as prescribed without any side effects.  Hypertension- Pt denies chest pain, SOB, dizziness, or heart palpitations.  Taking meds as directed w/o problems.  Denies medication side effects.    CAD - no recent CP or SOB.  Medications regularly.  Wanted to let me know because of her macular degeneration she may not be able to come to our office anymore.  She is having to start to rely on her daughter to bring her to doctor's appointments because of poor quality vision.  She has dry macular degeneration in her right eye and wet macular degeneration in the left for which she is receiving injections.  She is followed with Dr. Meda Coffee in [place] for this.  Review of Systems  BP 125/60   Pulse 72   Ht 5\' 4"  (1.626 m)   Wt 151 lb (68.5 kg)   SpO2 99%   BMI 25.92 kg/m     Allergies  Allergen Reactions  . Chlorthalidone Other (See Comments)    Extreme fatigue and weakness.   . Metoprolol Other (See Comments)    Dizziness.   . Sulfonamide Derivatives     Past Medical History:  Diagnosis Date  . CAD (coronary artery disease)   . Colon cancer [facility])    Chemotherapy  . High cholesterol   . HTN (hypertension)   . Hypothyroidism   . T2DM (type 2 diabetes mellitus) ([place])     Past Surgical History:  Procedure Laterality Date  . APPENDECTOMY    . CABG     1998.   Marland Kitchen CATARACT EXTRACTION, BILATERAL    . COLECTOMY  2003   for cancer. partial  . CORONARY STENT PLACEMENT  2008  . LAPAROSCOPIC CHOLECYSTECTOMY      Social History    Socioeconomic History  . Marital status: Married    Spouse name: Not on file  . Number of children: 4  . Years of education: Not on file  . Highest education level: Not on file  Social Needs  . Financial resource strain: Not on file  . Food insecurity - worry: Not on file  . Food insecurity - inability: Not on file  . Transportation needs - medical: Not on file  . Transportation needs - non-medical: Not on file  Occupational History    Comment: Retired  Tobacco Use  . Smoking status: Current Every Day Smoker    Packs/day: 0.25    Years: 60.00    Pack years: 15.00    Types: Cigarettes  . Smokeless tobacco: Never Used  . Tobacco comment: 5 cigarettes/day   Substance and Sexual Activity  . Alcohol use: Yes    Comment: Occasional glass of wine  . Drug use: No  . Sexual activity: Not Currently  Other Topics Concern  . Not on file  Social History Narrative   Married, 4 grown kids. Moved from [place], no regular exercise.     Family History  Problem Relation Age of Onset  . Cancer Daughter   . Diabetes Mother   .  Coronary artery disease Father        Died of MI at age 46  . Cancer Brother   . Alzheimer's disease Maternal Aunt   . Heart disease Paternal Aunt   . Heart disease Paternal Uncle   . Kidney disease Maternal Grandfather   . Heart disease Paternal Grandmother   . Stroke Paternal Grandfather     Outpatient Encounter Medications as of 12/31/2017  Medication Sig  . AMBULATORY NON FORMULARY MEDICATION Medication Name: Glucose meter strips ( [employer] brand). Test once a day . Dx. 250.0  . AMBULATORY NON FORMULARY MEDICATION Medication Name: Reli-On Ultia blood glucose test strip. Test 1 x a day.  Dx. 250.00  . amLODipine-benazepril (LOTREL) 5-10 MG capsule TAKE 1 CAPSULE BY MOUTH EVERY DAY  . aspirin 325 MG tablet Take 325 mg by mouth daily.    Marland Kitchen atorvastatin (LIPITOR) 40 MG tablet Take 1 tablet (40 mg total) by mouth daily.  . Cranberry-Vitamin C (CRANBERRY  CONCENTRATE/VITAMINC) 15000-100 MG CAPS Take 1 capsule by mouth.  . Garlic 7408 MG CAPS Take 1 capsule by mouth.  Marland Kitchen glimepiride (AMARYL) 4 MG tablet TAKE 1 TABLET (4 MG TOTAL) BY MOUTH DAILY WITH BREAKFAST.  Marland Kitchen levothyroxine (SYNTHROID, LEVOTHROID) 100 MCG tablet TAKE 1 TABLET BY MOUTH DAILY  . metFORMIN (GLUCOPHAGE) 500 MG tablet TAKE 1 TABLET (500 MG TOTAL) BY MOUTH 2 (TWO) TIMES DAILY WITH A MEAL.  . Multiple Vitamins-Minerals (PRESERVISION AREDS 2) CAPS Take 1 capsule by mouth daily.  . nitroGLYCERIN (NITROSTAT) 0.4 MG SL tablet Place 1 tablet (0.4 mg total) under the tongue every 5 (five) minutes as needed.  . [DISCONTINUED] estradiol (ESTRACE) 0.1 MG/GM vaginal cream 1 application to vaginal external area twice a week at bedtime.   No facility-administered encounter medications on file as of 12/31/2017.          Objective:   Physical Exam  Constitutional: She is oriented to person, place, and time. She appears well-developed and well-nourished.  HENT:  Head: Normocephalic and atraumatic.  Cardiovascular: Normal rate, regular rhythm and normal heart sounds.  Pulmonary/Chest: Effort normal and breath sounds normal.  Neurological: She is alert and oriented to person, place, and time.  Skin: Skin is warm and dry.  Psychiatric: She has a normal mood and affect. Her behavior is normal.       Assessment & Plan:  DM - Well controlled. Continue current regimen. Follow up in  4 months.    HTN -  Well controlled. Continue current regimen. Follow up in  4 months.    CAD - Stable. No changes.   Macular degeneration-follows with Dr. Meda Coffee and undergoing treatments particularly on her left eye.  This has started to affect her ability to drive.  Try to find a new doctor in [place] which is closer to where she lives.  Tob abuse - she is not interested in quitting smoking

## 2017-12-31 NOTE — Telephone Encounter (Signed)
Pt advised, no further questions at this time.

## 2018-01-06 ENCOUNTER — Ambulatory Visit (INDEPENDENT_AMBULATORY_CARE_PROVIDER_SITE_OTHER): Payer: Medicare Other | Admitting: Family Medicine

## 2018-01-06 VITALS — BP 130/68 | HR 67 | Temp 97.7°F

## 2018-01-06 DIAGNOSIS — I2510 Atherosclerotic heart disease of native coronary artery without angina pectoris: Secondary | ICD-10-CM

## 2018-01-06 DIAGNOSIS — R300 Dysuria: Secondary | ICD-10-CM | POA: Diagnosis not present

## 2018-01-06 DIAGNOSIS — I10 Essential (primary) hypertension: Secondary | ICD-10-CM | POA: Diagnosis not present

## 2018-01-06 DIAGNOSIS — E119 Type 2 diabetes mellitus without complications: Secondary | ICD-10-CM | POA: Diagnosis not present

## 2018-01-06 LAB — COMPLETE METABOLIC PANEL WITH GFR
AG RATIO: 1.3 (calc) (ref 1.0–2.5)
ALBUMIN MSPROF: 4.3 g/dL (ref 3.6–5.1)
ALT: 16 U/L (ref 6–29)
AST: 13 U/L (ref 10–35)
Alkaline phosphatase (APISO): 50 U/L (ref 33–130)
BUN: 14 mg/dL (ref 7–25)
CALCIUM: 9.5 mg/dL (ref 8.6–10.4)
CO2: 22 mmol/L (ref 20–32)
CREATININE: 0.77 mg/dL (ref 0.60–0.88)
Chloride: 106 mmol/L (ref 98–110)
GFR, EST AFRICAN AMERICAN: 84 mL/min/{1.73_m2} (ref >=60)
GFR, EST NON AFRICAN AMERICAN: 72 mL/min/{1.73_m2} (ref >=60)
GLOBULIN: 3.2 g/dL (ref 1.9–3.7)
Glucose, Bld: 126 mg/dL — ABNORMAL HIGH (ref 65–99)
POTASSIUM: 4.1 mmol/L (ref 3.5–5.3)
SODIUM: 139 mmol/L (ref 135–146)
Total Bilirubin: 0.5 mg/dL (ref 0.2–1.2)
Total Protein: 7.5 g/dL (ref 6.1–8.1)

## 2018-01-06 LAB — POCT URINALYSIS DIPSTICK
Bilirubin, UA: NEGATIVE
Glucose, UA: NEGATIVE
KETONES UA: NEGATIVE
NITRITE UA: NEGATIVE
PROTEIN UA: NEGATIVE
SPEC GRAV UA: 1.015 (ref 1.010–1.025)
UROBILINOGEN UA: 0.2 U/dL
pH, UA: 6 (ref 5.0–8.0)

## 2018-01-06 LAB — LIPID PANEL
CHOL/HDL RATIO: 2.6 (calc) (ref <5.0)
Cholesterol: 129 mg/dL (ref <200)
HDL: 50 mg/dL — AB (ref >50)
LDL Cholesterol (Calc): 59 mg/dL (calc)
NON-HDL CHOLESTEROL (CALC): 79 mg/dL (ref <130)
Triglycerides: 116 mg/dL (ref <150)

## 2018-01-06 MED ORDER — NITROFURANTOIN MONOHYD MACRO 100 MG PO CAPS: 100.0000 mg | ORAL_CAPSULE | Freq: Two times a day (BID) | ORAL | 0 refills | Status: AC

## 2018-01-06 NOTE — Progress Notes (Signed)
Dysuria-afebrile today.  Urinalysis performed.  Shows a small amount of blood and leukocytes but negative for nitrites.  We will go ahead and treat.  Culture sent.  Will call once results return. Will start [place].   Beatrice Lecher, MD

## 2018-01-06 NOTE — Progress Notes (Signed)
Left VM update for Pt.

## 2018-01-06 NOTE — Progress Notes (Signed)
Pt walked into clinic today complaining of dysuria. Denies any flank or abdominal pain. Pt states the symptoms began at 0200 this morning, and she took an Azo tablet. Pt reports she was seen by a urologist in April for recurrent UTI's, and was given techniques to empty her bladder. Pt has been doing that, and has not had a problem until today. Since Pt has taken Azo, we will run a urine culture as well. Advised Pt we would contact her with results.

## 2018-01-06 NOTE — Progress Notes (Signed)
All labs are normal. 

## 2018-01-09 ENCOUNTER — Encounter: Payer: Self-pay | Admitting: Family Medicine

## 2018-01-09 LAB — URINE CULTURE
MICRO NUMBER: 90029581
SPECIMEN QUALITY: ADEQUATE

## 2018-02-05 DIAGNOSIS — H353221 Exudative age-related macular degeneration, left eye, with active choroidal neovascularization: Secondary | ICD-10-CM | POA: Diagnosis not present

## 2018-02-05 DIAGNOSIS — H353212 Exudative age-related macular degeneration, right eye, with inactive choroidal neovascularization: Secondary | ICD-10-CM | POA: Diagnosis not present

## 2018-04-30 ENCOUNTER — Ambulatory Visit: Payer: Medicare Other | Admitting: Family Medicine

## 2018-04-30 DIAGNOSIS — I152 Hypertension secondary to endocrine disorders: Secondary | ICD-10-CM | POA: Diagnosis not present

## 2018-04-30 DIAGNOSIS — H3530 Unspecified macular degeneration: Secondary | ICD-10-CM | POA: Diagnosis not present

## 2018-04-30 DIAGNOSIS — E559 Vitamin D deficiency, unspecified: Secondary | ICD-10-CM | POA: Diagnosis not present

## 2018-04-30 DIAGNOSIS — E1165 Type 2 diabetes mellitus with hyperglycemia: Secondary | ICD-10-CM | POA: Diagnosis not present

## 2018-04-30 DIAGNOSIS — I25810 Atherosclerosis of coronary artery bypass graft(s) without angina pectoris: Secondary | ICD-10-CM | POA: Diagnosis not present

## 2018-04-30 DIAGNOSIS — E785 Hyperlipidemia, unspecified: Secondary | ICD-10-CM | POA: Diagnosis not present

## 2018-04-30 DIAGNOSIS — E079 Disorder of thyroid, unspecified: Secondary | ICD-10-CM | POA: Diagnosis not present

## 2018-05-19 DIAGNOSIS — H353221 Exudative age-related macular degeneration, left eye, with active choroidal neovascularization: Secondary | ICD-10-CM | POA: Diagnosis not present

## 2018-05-19 DIAGNOSIS — Z961 Presence of intraocular lens: Secondary | ICD-10-CM | POA: Diagnosis not present

## 2018-05-19 DIAGNOSIS — H353212 Exudative age-related macular degeneration, right eye, with inactive choroidal neovascularization: Secondary | ICD-10-CM | POA: Diagnosis not present

## 2018-08-11 DIAGNOSIS — H353221 Exudative age-related macular degeneration, left eye, with active choroidal neovascularization: Secondary | ICD-10-CM | POA: Diagnosis not present

## 2018-08-11 DIAGNOSIS — H353212 Exudative age-related macular degeneration, right eye, with inactive choroidal neovascularization: Secondary | ICD-10-CM | POA: Diagnosis not present

## 2018-09-03 DIAGNOSIS — E079 Disorder of thyroid, unspecified: Secondary | ICD-10-CM | POA: Diagnosis not present

## 2018-09-03 DIAGNOSIS — I25810 Atherosclerosis of coronary artery bypass graft(s) without angina pectoris: Secondary | ICD-10-CM | POA: Diagnosis not present

## 2018-09-03 DIAGNOSIS — E785 Hyperlipidemia, unspecified: Secondary | ICD-10-CM | POA: Diagnosis not present

## 2018-09-03 DIAGNOSIS — I152 Hypertension secondary to endocrine disorders: Secondary | ICD-10-CM | POA: Diagnosis not present

## 2018-09-03 DIAGNOSIS — E559 Vitamin D deficiency, unspecified: Secondary | ICD-10-CM | POA: Diagnosis not present

## 2018-09-03 DIAGNOSIS — E1165 Type 2 diabetes mellitus with hyperglycemia: Secondary | ICD-10-CM | POA: Diagnosis not present

## 2018-09-23 DIAGNOSIS — E1165 Type 2 diabetes mellitus with hyperglycemia: Secondary | ICD-10-CM | POA: Diagnosis not present

## 2018-10-08 DIAGNOSIS — E1165 Type 2 diabetes mellitus with hyperglycemia: Secondary | ICD-10-CM | POA: Diagnosis not present

## 2018-10-08 DIAGNOSIS — E559 Vitamin D deficiency, unspecified: Secondary | ICD-10-CM | POA: Diagnosis not present

## 2018-10-08 DIAGNOSIS — E079 Disorder of thyroid, unspecified: Secondary | ICD-10-CM | POA: Diagnosis not present

## 2018-10-08 DIAGNOSIS — I152 Hypertension secondary to endocrine disorders: Secondary | ICD-10-CM | POA: Diagnosis not present

## 2018-10-08 DIAGNOSIS — E785 Hyperlipidemia, unspecified: Secondary | ICD-10-CM | POA: Diagnosis not present

## 2018-10-08 DIAGNOSIS — Z0000 Encounter for general adult medical examination without abnormal findings: Secondary | ICD-10-CM | POA: Diagnosis not present

## 2018-11-04 DIAGNOSIS — H3530 Unspecified macular degeneration: Secondary | ICD-10-CM | POA: Diagnosis not present

## 2018-11-04 DIAGNOSIS — E782 Mixed hyperlipidemia: Secondary | ICD-10-CM | POA: Diagnosis not present

## 2018-11-04 DIAGNOSIS — I10 Essential (primary) hypertension: Secondary | ICD-10-CM | POA: Diagnosis not present

## 2018-11-04 DIAGNOSIS — E039 Hypothyroidism, unspecified: Secondary | ICD-10-CM | POA: Diagnosis not present

## 2018-11-04 DIAGNOSIS — E119 Type 2 diabetes mellitus without complications: Secondary | ICD-10-CM | POA: Diagnosis not present

## 2018-11-11 DIAGNOSIS — I10 Essential (primary) hypertension: Secondary | ICD-10-CM | POA: Diagnosis not present

## 2018-11-11 DIAGNOSIS — R42 Dizziness and giddiness: Secondary | ICD-10-CM | POA: Diagnosis not present

## 2018-11-18 DIAGNOSIS — Z961 Presence of intraocular lens: Secondary | ICD-10-CM | POA: Diagnosis not present

## 2018-11-18 DIAGNOSIS — H353221 Exudative age-related macular degeneration, left eye, with active choroidal neovascularization: Secondary | ICD-10-CM | POA: Diagnosis not present

## 2018-11-18 DIAGNOSIS — H353212 Exudative age-related macular degeneration, right eye, with inactive choroidal neovascularization: Secondary | ICD-10-CM | POA: Diagnosis not present

## 2018-12-17 DIAGNOSIS — H903 Sensorineural hearing loss, bilateral: Secondary | ICD-10-CM | POA: Diagnosis not present

## 2018-12-17 DIAGNOSIS — H8110 Benign paroxysmal vertigo, unspecified ear: Secondary | ICD-10-CM | POA: Diagnosis not present

## 2018-12-17 DIAGNOSIS — R0989 Other specified symptoms and signs involving the circulatory and respiratory systems: Secondary | ICD-10-CM | POA: Diagnosis not present

## 2018-12-17 DIAGNOSIS — H838X3 Other specified diseases of inner ear, bilateral: Secondary | ICD-10-CM | POA: Diagnosis not present

## 2018-12-29 DIAGNOSIS — E119 Type 2 diabetes mellitus without complications: Secondary | ICD-10-CM | POA: Diagnosis not present

## 2018-12-29 DIAGNOSIS — H8111 Benign paroxysmal vertigo, right ear: Secondary | ICD-10-CM | POA: Diagnosis not present

## 2019-01-07 DIAGNOSIS — H8111 Benign paroxysmal vertigo, right ear: Secondary | ICD-10-CM | POA: Diagnosis not present

## 2019-01-07 DIAGNOSIS — E119 Type 2 diabetes mellitus without complications: Secondary | ICD-10-CM | POA: Diagnosis not present

## 2019-01-21 DIAGNOSIS — I10 Essential (primary) hypertension: Secondary | ICD-10-CM | POA: Diagnosis not present

## 2019-01-21 DIAGNOSIS — E1165 Type 2 diabetes mellitus with hyperglycemia: Secondary | ICD-10-CM | POA: Diagnosis not present

## 2019-01-21 DIAGNOSIS — J309 Allergic rhinitis, unspecified: Secondary | ICD-10-CM | POA: Diagnosis not present

## 2019-01-21 DIAGNOSIS — E559 Vitamin D deficiency, unspecified: Secondary | ICD-10-CM | POA: Diagnosis not present

## 2019-01-21 DIAGNOSIS — E039 Hypothyroidism, unspecified: Secondary | ICD-10-CM | POA: Diagnosis not present

## 2019-01-21 DIAGNOSIS — I25810 Atherosclerosis of coronary artery bypass graft(s) without angina pectoris: Secondary | ICD-10-CM | POA: Diagnosis not present

## 2019-01-21 DIAGNOSIS — E782 Mixed hyperlipidemia: Secondary | ICD-10-CM | POA: Diagnosis not present

## 2019-02-01 DIAGNOSIS — H8111 Benign paroxysmal vertigo, right ear: Secondary | ICD-10-CM | POA: Diagnosis not present

## 2019-02-01 DIAGNOSIS — E119 Type 2 diabetes mellitus without complications: Secondary | ICD-10-CM | POA: Diagnosis not present

## 2019-06-16 DIAGNOSIS — H353221 Exudative age-related macular degeneration, left eye, with active choroidal neovascularization: Secondary | ICD-10-CM | POA: Diagnosis not present

## 2019-06-16 DIAGNOSIS — Z961 Presence of intraocular lens: Secondary | ICD-10-CM | POA: Diagnosis not present

## 2019-06-16 DIAGNOSIS — H353212 Exudative age-related macular degeneration, right eye, with inactive choroidal neovascularization: Secondary | ICD-10-CM | POA: Diagnosis not present

## 2019-07-20 DIAGNOSIS — H353212 Exudative age-related macular degeneration, right eye, with inactive choroidal neovascularization: Secondary | ICD-10-CM | POA: Diagnosis not present

## 2019-07-20 DIAGNOSIS — H3554 Dystrophies primarily involving the retinal pigment epithelium: Secondary | ICD-10-CM | POA: Diagnosis not present

## 2019-07-20 DIAGNOSIS — H353221 Exudative age-related macular degeneration, left eye, with active choroidal neovascularization: Secondary | ICD-10-CM | POA: Diagnosis not present

## 2019-07-29 ENCOUNTER — Other Ambulatory Visit: Payer: Self-pay

## 2019-08-12 DIAGNOSIS — J309 Allergic rhinitis, unspecified: Secondary | ICD-10-CM | POA: Diagnosis not present

## 2019-08-12 DIAGNOSIS — E559 Vitamin D deficiency, unspecified: Secondary | ICD-10-CM | POA: Diagnosis not present

## 2019-08-12 DIAGNOSIS — E039 Hypothyroidism, unspecified: Secondary | ICD-10-CM | POA: Diagnosis not present

## 2019-08-12 DIAGNOSIS — E1165 Type 2 diabetes mellitus with hyperglycemia: Secondary | ICD-10-CM | POA: Diagnosis not present

## 2019-08-12 DIAGNOSIS — I10 Essential (primary) hypertension: Secondary | ICD-10-CM | POA: Diagnosis not present

## 2019-08-12 DIAGNOSIS — E782 Mixed hyperlipidemia: Secondary | ICD-10-CM | POA: Diagnosis not present

## 2019-08-24 DIAGNOSIS — H353212 Exudative age-related macular degeneration, right eye, with inactive choroidal neovascularization: Secondary | ICD-10-CM | POA: Diagnosis not present

## 2019-08-24 DIAGNOSIS — H353221 Exudative age-related macular degeneration, left eye, with active choroidal neovascularization: Secondary | ICD-10-CM | POA: Diagnosis not present

## 2019-08-27 DIAGNOSIS — H353212 Exudative age-related macular degeneration, right eye, with inactive choroidal neovascularization: Secondary | ICD-10-CM | POA: Diagnosis not present

## 2019-08-27 DIAGNOSIS — H353221 Exudative age-related macular degeneration, left eye, with active choroidal neovascularization: Secondary | ICD-10-CM | POA: Diagnosis not present

## 2019-10-29 DIAGNOSIS — Z961 Presence of intraocular lens: Secondary | ICD-10-CM | POA: Diagnosis not present

## 2019-10-29 DIAGNOSIS — H353212 Exudative age-related macular degeneration, right eye, with inactive choroidal neovascularization: Secondary | ICD-10-CM | POA: Diagnosis not present

## 2019-10-29 DIAGNOSIS — H353221 Exudative age-related macular degeneration, left eye, with active choroidal neovascularization: Secondary | ICD-10-CM | POA: Diagnosis not present

## 2020-10-20 ENCOUNTER — Other Ambulatory Visit: Payer: Self-pay | Admitting: Legal Medicine
